# Patient Record
Sex: Male | Born: 1940 | Hispanic: No | Marital: Married | Smoking: Current every day smoker
Health system: Southern US, Community
[De-identification: ages and names within clinical notes are randomized; demographics above are authoritative.]

## PROBLEM LIST (undated history)

## (undated) DIAGNOSIS — I999 Unspecified disorder of circulatory system: Secondary | ICD-10-CM

## (undated) DIAGNOSIS — Z86010 Personal history of colonic polyps: Secondary | ICD-10-CM

## (undated) DIAGNOSIS — F17200 Nicotine dependence, unspecified, uncomplicated: Secondary | ICD-10-CM

## (undated) DIAGNOSIS — N529 Male erectile dysfunction, unspecified: Secondary | ICD-10-CM

## (undated) DIAGNOSIS — I779 Disorder of arteries and arterioles, unspecified: Secondary | ICD-10-CM

## (undated) DIAGNOSIS — E669 Obesity, unspecified: Secondary | ICD-10-CM

## (undated) DIAGNOSIS — R7303 Prediabetes: Secondary | ICD-10-CM

## (undated) DIAGNOSIS — E785 Hyperlipidemia, unspecified: Secondary | ICD-10-CM

## (undated) DIAGNOSIS — N400 Enlarged prostate without lower urinary tract symptoms: Secondary | ICD-10-CM

## (undated) DIAGNOSIS — I10 Essential (primary) hypertension: Secondary | ICD-10-CM

## (undated) HISTORY — DX: Personal history of colonic polyps: Z86.010

## (undated) HISTORY — DX: Prediabetes: R73.03

## (undated) HISTORY — DX: Nicotine dependence, unspecified, uncomplicated: F17.200

## (undated) HISTORY — DX: Benign prostatic hyperplasia without lower urinary tract symptoms: N40.0

## (undated) HISTORY — DX: Unspecified disorder of circulatory system: I99.9

## (undated) HISTORY — PX: APPENDECTOMY: SHX54

## (undated) HISTORY — DX: Disorder of arteries and arterioles, unspecified: I77.9

## (undated) HISTORY — DX: Obesity, unspecified: E66.9

## (undated) HISTORY — PX: OTHER SURGICAL HISTORY: SHX169

## (undated) HISTORY — DX: Essential (primary) hypertension: I10

## (undated) HISTORY — DX: Male erectile dysfunction, unspecified: N52.9

## (undated) HISTORY — DX: Hyperlipidemia, unspecified: E78.5

---

## 2011-12-23 ENCOUNTER — Other Ambulatory Visit (HOSPITAL_BASED_OUTPATIENT_CLINIC_OR_DEPARTMENT_OTHER): Payer: Self-pay | Admitting: Family Medicine

## 2011-12-23 DIAGNOSIS — M545 Low back pain, unspecified: Secondary | ICD-10-CM

## 2011-12-26 ENCOUNTER — Ambulatory Visit (HOSPITAL_BASED_OUTPATIENT_CLINIC_OR_DEPARTMENT_OTHER)
Admission: RE | Admit: 2011-12-26 | Discharge: 2011-12-26 | Disposition: A | Discharge status: Home / Self Care | Payer: Medicare Other | Source: Ambulatory Visit | Attending: Family Medicine | Admitting: Family Medicine

## 2011-12-26 DIAGNOSIS — M5137 Other intervertebral disc degeneration, lumbosacral region: Secondary | ICD-10-CM

## 2011-12-26 DIAGNOSIS — M545 Low back pain, unspecified: Secondary | ICD-10-CM | POA: Insufficient documentation

## 2011-12-26 DIAGNOSIS — M519 Unspecified thoracic, thoracolumbar and lumbosacral intervertebral disc disorder: Secondary | ICD-10-CM | POA: Insufficient documentation

## 2011-12-26 DIAGNOSIS — R5381 Other malaise: Secondary | ICD-10-CM

## 2011-12-26 DIAGNOSIS — M5126 Other intervertebral disc displacement, lumbar region: Secondary | ICD-10-CM | POA: Insufficient documentation

## 2011-12-26 DIAGNOSIS — M6281 Muscle weakness (generalized): Secondary | ICD-10-CM | POA: Insufficient documentation

## 2016-01-31 DIAGNOSIS — C44629 Squamous cell carcinoma of skin of left upper limb, including shoulder: Secondary | ICD-10-CM | POA: Diagnosis not present

## 2016-01-31 DIAGNOSIS — L718 Other rosacea: Secondary | ICD-10-CM | POA: Diagnosis not present

## 2016-01-31 DIAGNOSIS — L570 Actinic keratosis: Secondary | ICD-10-CM | POA: Diagnosis not present

## 2016-01-31 DIAGNOSIS — C4441 Basal cell carcinoma of skin of scalp and neck: Secondary | ICD-10-CM | POA: Diagnosis not present

## 2016-01-31 DIAGNOSIS — L821 Other seborrheic keratosis: Secondary | ICD-10-CM | POA: Diagnosis not present

## 2016-01-31 DIAGNOSIS — L853 Xerosis cutis: Secondary | ICD-10-CM | POA: Diagnosis not present

## 2016-01-31 DIAGNOSIS — D1801 Hemangioma of skin and subcutaneous tissue: Secondary | ICD-10-CM | POA: Diagnosis not present

## 2016-01-31 DIAGNOSIS — D485 Neoplasm of uncertain behavior of skin: Secondary | ICD-10-CM | POA: Diagnosis not present

## 2016-01-31 DIAGNOSIS — Z85828 Personal history of other malignant neoplasm of skin: Secondary | ICD-10-CM | POA: Diagnosis not present

## 2016-03-23 DIAGNOSIS — Z0000 Encounter for general adult medical examination without abnormal findings: Secondary | ICD-10-CM | POA: Diagnosis not present

## 2016-03-23 DIAGNOSIS — R972 Elevated prostate specific antigen [PSA]: Secondary | ICD-10-CM | POA: Diagnosis not present

## 2016-05-22 DIAGNOSIS — H6123 Impacted cerumen, bilateral: Secondary | ICD-10-CM | POA: Diagnosis not present

## 2016-05-22 DIAGNOSIS — H9121 Sudden idiopathic hearing loss, right ear: Secondary | ICD-10-CM | POA: Diagnosis not present

## 2016-07-13 DIAGNOSIS — F17200 Nicotine dependence, unspecified, uncomplicated: Secondary | ICD-10-CM | POA: Diagnosis not present

## 2016-07-13 DIAGNOSIS — I10 Essential (primary) hypertension: Secondary | ICD-10-CM | POA: Diagnosis not present

## 2016-07-13 DIAGNOSIS — E7800 Pure hypercholesterolemia, unspecified: Secondary | ICD-10-CM | POA: Diagnosis not present

## 2016-07-13 DIAGNOSIS — R7303 Prediabetes: Secondary | ICD-10-CM | POA: Diagnosis not present

## 2016-07-13 DIAGNOSIS — E669 Obesity, unspecified: Secondary | ICD-10-CM | POA: Diagnosis not present

## 2016-08-25 DIAGNOSIS — L718 Other rosacea: Secondary | ICD-10-CM | POA: Diagnosis not present

## 2016-08-25 DIAGNOSIS — L812 Freckles: Secondary | ICD-10-CM | POA: Diagnosis not present

## 2016-08-25 DIAGNOSIS — B078 Other viral warts: Secondary | ICD-10-CM | POA: Diagnosis not present

## 2016-08-25 DIAGNOSIS — D485 Neoplasm of uncertain behavior of skin: Secondary | ICD-10-CM | POA: Diagnosis not present

## 2016-08-25 DIAGNOSIS — L570 Actinic keratosis: Secondary | ICD-10-CM | POA: Diagnosis not present

## 2016-08-25 DIAGNOSIS — B079 Viral wart, unspecified: Secondary | ICD-10-CM | POA: Diagnosis not present

## 2016-08-25 DIAGNOSIS — Z85828 Personal history of other malignant neoplasm of skin: Secondary | ICD-10-CM | POA: Diagnosis not present

## 2016-08-25 DIAGNOSIS — L281 Prurigo nodularis: Secondary | ICD-10-CM | POA: Diagnosis not present

## 2016-08-25 DIAGNOSIS — L821 Other seborrheic keratosis: Secondary | ICD-10-CM | POA: Diagnosis not present

## 2016-08-25 DIAGNOSIS — D1801 Hemangioma of skin and subcutaneous tissue: Secondary | ICD-10-CM | POA: Diagnosis not present

## 2016-08-25 DIAGNOSIS — L738 Other specified follicular disorders: Secondary | ICD-10-CM | POA: Diagnosis not present

## 2016-09-29 DIAGNOSIS — R972 Elevated prostate specific antigen [PSA]: Secondary | ICD-10-CM | POA: Diagnosis not present

## 2017-01-22 DIAGNOSIS — F17200 Nicotine dependence, unspecified, uncomplicated: Secondary | ICD-10-CM | POA: Diagnosis not present

## 2017-01-22 DIAGNOSIS — I10 Essential (primary) hypertension: Secondary | ICD-10-CM | POA: Diagnosis not present

## 2017-01-22 DIAGNOSIS — E669 Obesity, unspecified: Secondary | ICD-10-CM | POA: Diagnosis not present

## 2017-01-22 DIAGNOSIS — E7800 Pure hypercholesterolemia, unspecified: Secondary | ICD-10-CM | POA: Diagnosis not present

## 2017-01-22 DIAGNOSIS — R7303 Prediabetes: Secondary | ICD-10-CM | POA: Diagnosis not present

## 2017-02-25 DIAGNOSIS — L570 Actinic keratosis: Secondary | ICD-10-CM | POA: Diagnosis not present

## 2017-02-25 DIAGNOSIS — L821 Other seborrheic keratosis: Secondary | ICD-10-CM | POA: Diagnosis not present

## 2017-02-25 DIAGNOSIS — Z85828 Personal history of other malignant neoplasm of skin: Secondary | ICD-10-CM | POA: Diagnosis not present

## 2017-02-25 DIAGNOSIS — L853 Xerosis cutis: Secondary | ICD-10-CM | POA: Diagnosis not present

## 2017-06-07 DIAGNOSIS — M722 Plantar fascial fibromatosis: Secondary | ICD-10-CM | POA: Diagnosis not present

## 2017-07-06 DIAGNOSIS — M722 Plantar fascial fibromatosis: Secondary | ICD-10-CM | POA: Diagnosis not present

## 2017-07-22 DIAGNOSIS — R7303 Prediabetes: Secondary | ICD-10-CM | POA: Diagnosis not present

## 2017-07-22 DIAGNOSIS — I10 Essential (primary) hypertension: Secondary | ICD-10-CM | POA: Diagnosis not present

## 2017-07-22 DIAGNOSIS — E7800 Pure hypercholesterolemia, unspecified: Secondary | ICD-10-CM | POA: Diagnosis not present

## 2017-07-22 DIAGNOSIS — E669 Obesity, unspecified: Secondary | ICD-10-CM | POA: Diagnosis not present

## 2017-08-25 DIAGNOSIS — Z86010 Personal history of colonic polyps: Secondary | ICD-10-CM | POA: Diagnosis not present

## 2017-08-25 DIAGNOSIS — K5730 Diverticulosis of large intestine without perforation or abscess without bleeding: Secondary | ICD-10-CM | POA: Diagnosis not present

## 2017-08-25 DIAGNOSIS — K635 Polyp of colon: Secondary | ICD-10-CM | POA: Diagnosis not present

## 2017-08-31 DIAGNOSIS — K635 Polyp of colon: Secondary | ICD-10-CM | POA: Diagnosis not present

## 2017-09-02 DIAGNOSIS — L821 Other seborrheic keratosis: Secondary | ICD-10-CM | POA: Diagnosis not present

## 2017-09-02 DIAGNOSIS — L570 Actinic keratosis: Secondary | ICD-10-CM | POA: Diagnosis not present

## 2017-09-02 DIAGNOSIS — D1801 Hemangioma of skin and subcutaneous tissue: Secondary | ICD-10-CM | POA: Diagnosis not present

## 2017-09-02 DIAGNOSIS — Z85828 Personal history of other malignant neoplasm of skin: Secondary | ICD-10-CM | POA: Diagnosis not present

## 2017-10-07 DIAGNOSIS — R972 Elevated prostate specific antigen [PSA]: Secondary | ICD-10-CM | POA: Diagnosis not present

## 2017-10-11 DIAGNOSIS — R351 Nocturia: Secondary | ICD-10-CM | POA: Diagnosis not present

## 2018-01-24 DIAGNOSIS — E669 Obesity, unspecified: Secondary | ICD-10-CM | POA: Diagnosis not present

## 2018-01-24 DIAGNOSIS — Z6833 Body mass index (BMI) 33.0-33.9, adult: Secondary | ICD-10-CM | POA: Diagnosis not present

## 2018-01-24 DIAGNOSIS — E7800 Pure hypercholesterolemia, unspecified: Secondary | ICD-10-CM | POA: Diagnosis not present

## 2018-01-24 DIAGNOSIS — I10 Essential (primary) hypertension: Secondary | ICD-10-CM | POA: Diagnosis not present

## 2018-02-28 DIAGNOSIS — L853 Xerosis cutis: Secondary | ICD-10-CM | POA: Diagnosis not present

## 2018-02-28 DIAGNOSIS — Z85828 Personal history of other malignant neoplasm of skin: Secondary | ICD-10-CM | POA: Diagnosis not present

## 2018-02-28 DIAGNOSIS — L821 Other seborrheic keratosis: Secondary | ICD-10-CM | POA: Diagnosis not present

## 2018-02-28 DIAGNOSIS — L570 Actinic keratosis: Secondary | ICD-10-CM | POA: Diagnosis not present

## 2018-07-25 DIAGNOSIS — E669 Obesity, unspecified: Secondary | ICD-10-CM | POA: Diagnosis not present

## 2018-07-25 DIAGNOSIS — Z0000 Encounter for general adult medical examination without abnormal findings: Secondary | ICD-10-CM | POA: Diagnosis not present

## 2018-07-25 DIAGNOSIS — E7800 Pure hypercholesterolemia, unspecified: Secondary | ICD-10-CM | POA: Diagnosis not present

## 2018-07-25 DIAGNOSIS — I10 Essential (primary) hypertension: Secondary | ICD-10-CM | POA: Diagnosis not present

## 2018-09-02 DIAGNOSIS — Z85828 Personal history of other malignant neoplasm of skin: Secondary | ICD-10-CM | POA: Diagnosis not present

## 2018-09-02 DIAGNOSIS — L570 Actinic keratosis: Secondary | ICD-10-CM | POA: Diagnosis not present

## 2018-09-02 DIAGNOSIS — L821 Other seborrheic keratosis: Secondary | ICD-10-CM | POA: Diagnosis not present

## 2018-09-02 DIAGNOSIS — D1801 Hemangioma of skin and subcutaneous tissue: Secondary | ICD-10-CM | POA: Diagnosis not present

## 2018-10-10 DIAGNOSIS — N400 Enlarged prostate without lower urinary tract symptoms: Secondary | ICD-10-CM | POA: Diagnosis not present

## 2018-10-25 DIAGNOSIS — R3912 Poor urinary stream: Secondary | ICD-10-CM | POA: Diagnosis not present

## 2018-10-25 DIAGNOSIS — N401 Enlarged prostate with lower urinary tract symptoms: Secondary | ICD-10-CM | POA: Diagnosis not present

## 2019-01-25 DIAGNOSIS — E669 Obesity, unspecified: Secondary | ICD-10-CM | POA: Diagnosis not present

## 2019-01-25 DIAGNOSIS — E7800 Pure hypercholesterolemia, unspecified: Secondary | ICD-10-CM | POA: Diagnosis not present

## 2019-01-25 DIAGNOSIS — I10 Essential (primary) hypertension: Secondary | ICD-10-CM | POA: Diagnosis not present

## 2019-03-06 DIAGNOSIS — L821 Other seborrheic keratosis: Secondary | ICD-10-CM | POA: Diagnosis not present

## 2019-03-06 DIAGNOSIS — Z85828 Personal history of other malignant neoplasm of skin: Secondary | ICD-10-CM | POA: Diagnosis not present

## 2019-03-06 DIAGNOSIS — L570 Actinic keratosis: Secondary | ICD-10-CM | POA: Diagnosis not present

## 2019-03-06 DIAGNOSIS — L853 Xerosis cutis: Secondary | ICD-10-CM | POA: Diagnosis not present

## 2019-06-12 DIAGNOSIS — H524 Presbyopia: Secondary | ICD-10-CM | POA: Diagnosis not present

## 2019-06-12 DIAGNOSIS — H0102B Squamous blepharitis left eye, upper and lower eyelids: Secondary | ICD-10-CM | POA: Diagnosis not present

## 2019-06-12 DIAGNOSIS — H0102A Squamous blepharitis right eye, upper and lower eyelids: Secondary | ICD-10-CM | POA: Diagnosis not present

## 2019-06-12 DIAGNOSIS — H02132 Senile ectropion of right lower eyelid: Secondary | ICD-10-CM | POA: Diagnosis not present

## 2019-06-12 DIAGNOSIS — H2513 Age-related nuclear cataract, bilateral: Secondary | ICD-10-CM | POA: Diagnosis not present

## 2019-07-14 DIAGNOSIS — H0102B Squamous blepharitis left eye, upper and lower eyelids: Secondary | ICD-10-CM | POA: Diagnosis not present

## 2019-07-14 DIAGNOSIS — H02132 Senile ectropion of right lower eyelid: Secondary | ICD-10-CM | POA: Diagnosis not present

## 2019-07-14 DIAGNOSIS — H0102A Squamous blepharitis right eye, upper and lower eyelids: Secondary | ICD-10-CM | POA: Diagnosis not present

## 2019-07-27 DIAGNOSIS — Z0120 Encounter for dental examination and cleaning without abnormal findings: Secondary | ICD-10-CM | POA: Diagnosis not present

## 2019-08-03 DIAGNOSIS — M79606 Pain in leg, unspecified: Secondary | ICD-10-CM | POA: Diagnosis not present

## 2019-08-04 DIAGNOSIS — M79606 Pain in leg, unspecified: Secondary | ICD-10-CM | POA: Diagnosis not present

## 2019-08-04 DIAGNOSIS — M79604 Pain in right leg: Secondary | ICD-10-CM | POA: Diagnosis not present

## 2019-08-04 DIAGNOSIS — M79605 Pain in left leg: Secondary | ICD-10-CM | POA: Diagnosis not present

## 2019-08-09 DIAGNOSIS — Z0000 Encounter for general adult medical examination without abnormal findings: Secondary | ICD-10-CM | POA: Diagnosis not present

## 2019-08-09 DIAGNOSIS — I10 Essential (primary) hypertension: Secondary | ICD-10-CM | POA: Diagnosis not present

## 2019-08-09 DIAGNOSIS — E7800 Pure hypercholesterolemia, unspecified: Secondary | ICD-10-CM | POA: Diagnosis not present

## 2019-08-09 DIAGNOSIS — E669 Obesity, unspecified: Secondary | ICD-10-CM | POA: Diagnosis not present

## 2019-08-14 DIAGNOSIS — H0102B Squamous blepharitis left eye, upper and lower eyelids: Secondary | ICD-10-CM | POA: Diagnosis not present

## 2019-08-14 DIAGNOSIS — H0102A Squamous blepharitis right eye, upper and lower eyelids: Secondary | ICD-10-CM | POA: Diagnosis not present

## 2019-08-18 DIAGNOSIS — Z720 Tobacco use: Secondary | ICD-10-CM | POA: Diagnosis not present

## 2019-08-18 DIAGNOSIS — I739 Peripheral vascular disease, unspecified: Secondary | ICD-10-CM | POA: Diagnosis not present

## 2019-08-23 ENCOUNTER — Encounter: Payer: Self-pay | Admitting: Vascular Surgery

## 2019-09-06 DIAGNOSIS — I10 Essential (primary) hypertension: Secondary | ICD-10-CM | POA: Diagnosis not present

## 2019-09-06 DIAGNOSIS — Z79899 Other long term (current) drug therapy: Secondary | ICD-10-CM | POA: Diagnosis not present

## 2019-09-06 DIAGNOSIS — F17200 Nicotine dependence, unspecified, uncomplicated: Secondary | ICD-10-CM | POA: Diagnosis not present

## 2019-09-06 DIAGNOSIS — Z7982 Long term (current) use of aspirin: Secondary | ICD-10-CM | POA: Diagnosis not present

## 2019-09-06 DIAGNOSIS — I771 Stricture of artery: Secondary | ICD-10-CM | POA: Diagnosis not present

## 2019-09-06 DIAGNOSIS — I739 Peripheral vascular disease, unspecified: Secondary | ICD-10-CM | POA: Diagnosis not present

## 2019-09-06 DIAGNOSIS — E785 Hyperlipidemia, unspecified: Secondary | ICD-10-CM | POA: Diagnosis not present

## 2019-09-11 DIAGNOSIS — L570 Actinic keratosis: Secondary | ICD-10-CM | POA: Diagnosis not present

## 2019-09-11 DIAGNOSIS — Z85828 Personal history of other malignant neoplasm of skin: Secondary | ICD-10-CM | POA: Diagnosis not present

## 2019-09-11 DIAGNOSIS — L821 Other seborrheic keratosis: Secondary | ICD-10-CM | POA: Diagnosis not present

## 2019-09-11 DIAGNOSIS — L812 Freckles: Secondary | ICD-10-CM | POA: Diagnosis not present

## 2019-09-21 DIAGNOSIS — Z720 Tobacco use: Secondary | ICD-10-CM | POA: Diagnosis not present

## 2019-09-21 DIAGNOSIS — Z48812 Encounter for surgical aftercare following surgery on the circulatory system: Secondary | ICD-10-CM | POA: Diagnosis not present

## 2019-09-21 DIAGNOSIS — I739 Peripheral vascular disease, unspecified: Secondary | ICD-10-CM | POA: Diagnosis not present

## 2019-09-27 DIAGNOSIS — N400 Enlarged prostate without lower urinary tract symptoms: Secondary | ICD-10-CM | POA: Diagnosis not present

## 2019-10-02 DIAGNOSIS — Z125 Encounter for screening for malignant neoplasm of prostate: Secondary | ICD-10-CM | POA: Diagnosis not present

## 2019-10-02 DIAGNOSIS — R3912 Poor urinary stream: Secondary | ICD-10-CM | POA: Diagnosis not present

## 2019-10-02 DIAGNOSIS — R972 Elevated prostate specific antigen [PSA]: Secondary | ICD-10-CM | POA: Diagnosis not present

## 2019-10-12 DIAGNOSIS — Z720 Tobacco use: Secondary | ICD-10-CM | POA: Diagnosis not present

## 2019-10-12 DIAGNOSIS — I739 Peripheral vascular disease, unspecified: Secondary | ICD-10-CM | POA: Diagnosis not present

## 2019-10-12 DIAGNOSIS — Z48812 Encounter for surgical aftercare following surgery on the circulatory system: Secondary | ICD-10-CM | POA: Diagnosis not present

## 2019-11-27 DIAGNOSIS — Z0120 Encounter for dental examination and cleaning without abnormal findings: Secondary | ICD-10-CM | POA: Diagnosis not present

## 2019-12-04 DIAGNOSIS — Z0120 Encounter for dental examination and cleaning without abnormal findings: Secondary | ICD-10-CM | POA: Diagnosis not present

## 2020-03-12 DIAGNOSIS — L578 Other skin changes due to chronic exposure to nonionizing radiation: Secondary | ICD-10-CM | POA: Diagnosis not present

## 2020-03-12 DIAGNOSIS — L821 Other seborrheic keratosis: Secondary | ICD-10-CM | POA: Diagnosis not present

## 2020-03-12 DIAGNOSIS — L570 Actinic keratosis: Secondary | ICD-10-CM | POA: Diagnosis not present

## 2020-03-12 DIAGNOSIS — Z85828 Personal history of other malignant neoplasm of skin: Secondary | ICD-10-CM | POA: Diagnosis not present

## 2020-04-08 DIAGNOSIS — N400 Enlarged prostate without lower urinary tract symptoms: Secondary | ICD-10-CM | POA: Diagnosis not present

## 2020-04-11 DIAGNOSIS — Z720 Tobacco use: Secondary | ICD-10-CM | POA: Diagnosis not present

## 2020-04-11 DIAGNOSIS — I739 Peripheral vascular disease, unspecified: Secondary | ICD-10-CM | POA: Diagnosis not present

## 2020-04-11 DIAGNOSIS — Z48812 Encounter for surgical aftercare following surgery on the circulatory system: Secondary | ICD-10-CM | POA: Diagnosis not present

## 2020-04-15 DIAGNOSIS — N401 Enlarged prostate with lower urinary tract symptoms: Secondary | ICD-10-CM | POA: Diagnosis not present

## 2020-04-15 DIAGNOSIS — R972 Elevated prostate specific antigen [PSA]: Secondary | ICD-10-CM | POA: Diagnosis not present

## 2020-04-15 DIAGNOSIS — R3912 Poor urinary stream: Secondary | ICD-10-CM | POA: Diagnosis not present

## 2020-04-23 ENCOUNTER — Other Ambulatory Visit: Payer: Self-pay | Admitting: Urology

## 2020-04-23 DIAGNOSIS — R972 Elevated prostate specific antigen [PSA]: Secondary | ICD-10-CM

## 2020-05-18 ENCOUNTER — Other Ambulatory Visit: Payer: Self-pay

## 2020-05-18 ENCOUNTER — Ambulatory Visit
Admission: RE | Admit: 2020-05-18 | Discharge: 2020-05-18 | Disposition: A | Discharge status: Home / Self Care | Payer: Medicare Other | Source: Ambulatory Visit | Attending: Urology | Admitting: Urology

## 2020-05-18 DIAGNOSIS — R972 Elevated prostate specific antigen [PSA]: Secondary | ICD-10-CM | POA: Diagnosis not present

## 2020-05-18 MED ORDER — GADOBENATE DIMEGLUMINE 529 MG/ML IV SOLN
20.0000 mL | Freq: Once | INTRAVENOUS | Status: AC | PRN
  Administered 2020-05-18: 20 mL via INTRAVENOUS

## 2020-08-05 DIAGNOSIS — Z0120 Encounter for dental examination and cleaning without abnormal findings: Secondary | ICD-10-CM | POA: Diagnosis not present

## 2020-08-13 DIAGNOSIS — I779 Disorder of arteries and arterioles, unspecified: Secondary | ICD-10-CM | POA: Diagnosis not present

## 2020-08-13 DIAGNOSIS — I10 Essential (primary) hypertension: Secondary | ICD-10-CM | POA: Diagnosis not present

## 2020-08-13 DIAGNOSIS — F17200 Nicotine dependence, unspecified, uncomplicated: Secondary | ICD-10-CM | POA: Diagnosis not present

## 2020-08-13 DIAGNOSIS — E7800 Pure hypercholesterolemia, unspecified: Secondary | ICD-10-CM | POA: Diagnosis not present

## 2020-08-19 DIAGNOSIS — H2513 Age-related nuclear cataract, bilateral: Secondary | ICD-10-CM | POA: Diagnosis not present

## 2020-08-19 DIAGNOSIS — H04123 Dry eye syndrome of bilateral lacrimal glands: Secondary | ICD-10-CM | POA: Diagnosis not present

## 2020-09-16 DIAGNOSIS — H04123 Dry eye syndrome of bilateral lacrimal glands: Secondary | ICD-10-CM | POA: Diagnosis not present

## 2020-09-16 DIAGNOSIS — H2513 Age-related nuclear cataract, bilateral: Secondary | ICD-10-CM | POA: Diagnosis not present

## 2020-09-24 DIAGNOSIS — L578 Other skin changes due to chronic exposure to nonionizing radiation: Secondary | ICD-10-CM | POA: Diagnosis not present

## 2020-09-24 DIAGNOSIS — L570 Actinic keratosis: Secondary | ICD-10-CM | POA: Diagnosis not present

## 2020-09-24 DIAGNOSIS — Z85828 Personal history of other malignant neoplasm of skin: Secondary | ICD-10-CM | POA: Diagnosis not present

## 2020-09-24 DIAGNOSIS — L738 Other specified follicular disorders: Secondary | ICD-10-CM | POA: Diagnosis not present

## 2020-09-24 DIAGNOSIS — C44622 Squamous cell carcinoma of skin of right upper limb, including shoulder: Secondary | ICD-10-CM | POA: Diagnosis not present

## 2020-10-11 DIAGNOSIS — H25013 Cortical age-related cataract, bilateral: Secondary | ICD-10-CM | POA: Diagnosis not present

## 2020-10-16 DIAGNOSIS — Z48812 Encounter for surgical aftercare following surgery on the circulatory system: Secondary | ICD-10-CM | POA: Diagnosis not present

## 2020-10-16 DIAGNOSIS — I739 Peripheral vascular disease, unspecified: Secondary | ICD-10-CM | POA: Diagnosis not present

## 2020-10-29 DIAGNOSIS — Z48812 Encounter for surgical aftercare following surgery on the circulatory system: Secondary | ICD-10-CM | POA: Diagnosis not present

## 2020-10-29 DIAGNOSIS — Z720 Tobacco use: Secondary | ICD-10-CM | POA: Diagnosis not present

## 2020-10-29 DIAGNOSIS — I739 Peripheral vascular disease, unspecified: Secondary | ICD-10-CM | POA: Diagnosis not present

## 2020-11-12 DIAGNOSIS — R972 Elevated prostate specific antigen [PSA]: Secondary | ICD-10-CM | POA: Diagnosis not present

## 2020-11-19 DIAGNOSIS — R3912 Poor urinary stream: Secondary | ICD-10-CM | POA: Diagnosis not present

## 2020-11-19 DIAGNOSIS — R972 Elevated prostate specific antigen [PSA]: Secondary | ICD-10-CM | POA: Diagnosis not present

## 2020-11-19 DIAGNOSIS — R351 Nocturia: Secondary | ICD-10-CM | POA: Diagnosis not present

## 2020-12-26 DIAGNOSIS — D075 Carcinoma in situ of prostate: Secondary | ICD-10-CM | POA: Diagnosis not present

## 2020-12-26 DIAGNOSIS — R972 Elevated prostate specific antigen [PSA]: Secondary | ICD-10-CM | POA: Diagnosis not present

## 2021-01-01 DIAGNOSIS — H25811 Combined forms of age-related cataract, right eye: Secondary | ICD-10-CM | POA: Diagnosis not present

## 2021-01-01 DIAGNOSIS — H2511 Age-related nuclear cataract, right eye: Secondary | ICD-10-CM | POA: Diagnosis not present

## 2021-01-15 DIAGNOSIS — H25812 Combined forms of age-related cataract, left eye: Secondary | ICD-10-CM | POA: Diagnosis not present

## 2021-01-15 DIAGNOSIS — H2512 Age-related nuclear cataract, left eye: Secondary | ICD-10-CM | POA: Diagnosis not present

## 2021-02-14 DIAGNOSIS — F17200 Nicotine dependence, unspecified, uncomplicated: Secondary | ICD-10-CM | POA: Diagnosis not present

## 2021-02-14 DIAGNOSIS — E7800 Pure hypercholesterolemia, unspecified: Secondary | ICD-10-CM | POA: Diagnosis not present

## 2021-02-14 DIAGNOSIS — I10 Essential (primary) hypertension: Secondary | ICD-10-CM | POA: Diagnosis not present

## 2021-02-14 DIAGNOSIS — I779 Disorder of arteries and arterioles, unspecified: Secondary | ICD-10-CM | POA: Diagnosis not present

## 2021-03-25 DIAGNOSIS — Z85828 Personal history of other malignant neoplasm of skin: Secondary | ICD-10-CM | POA: Diagnosis not present

## 2021-03-25 DIAGNOSIS — L853 Xerosis cutis: Secondary | ICD-10-CM | POA: Diagnosis not present

## 2021-03-25 DIAGNOSIS — D0461 Carcinoma in situ of skin of right upper limb, including shoulder: Secondary | ICD-10-CM | POA: Diagnosis not present

## 2021-03-25 DIAGNOSIS — C44629 Squamous cell carcinoma of skin of left upper limb, including shoulder: Secondary | ICD-10-CM | POA: Diagnosis not present

## 2021-03-25 DIAGNOSIS — C44319 Basal cell carcinoma of skin of other parts of face: Secondary | ICD-10-CM | POA: Diagnosis not present

## 2021-03-25 DIAGNOSIS — L820 Inflamed seborrheic keratosis: Secondary | ICD-10-CM | POA: Diagnosis not present

## 2021-03-25 DIAGNOSIS — L821 Other seborrheic keratosis: Secondary | ICD-10-CM | POA: Diagnosis not present

## 2021-03-25 DIAGNOSIS — C44329 Squamous cell carcinoma of skin of other parts of face: Secondary | ICD-10-CM | POA: Diagnosis not present

## 2021-04-23 DIAGNOSIS — Z85828 Personal history of other malignant neoplasm of skin: Secondary | ICD-10-CM | POA: Diagnosis not present

## 2021-04-23 DIAGNOSIS — C44321 Squamous cell carcinoma of skin of nose: Secondary | ICD-10-CM | POA: Diagnosis not present

## 2021-05-14 DIAGNOSIS — C44219 Basal cell carcinoma of skin of left ear and external auricular canal: Secondary | ICD-10-CM | POA: Diagnosis not present

## 2021-05-14 DIAGNOSIS — D485 Neoplasm of uncertain behavior of skin: Secondary | ICD-10-CM | POA: Diagnosis not present

## 2021-06-10 DIAGNOSIS — C44219 Basal cell carcinoma of skin of left ear and external auricular canal: Secondary | ICD-10-CM | POA: Diagnosis not present

## 2021-06-10 DIAGNOSIS — Z85828 Personal history of other malignant neoplasm of skin: Secondary | ICD-10-CM | POA: Diagnosis not present

## 2021-07-01 DIAGNOSIS — R972 Elevated prostate specific antigen [PSA]: Secondary | ICD-10-CM | POA: Diagnosis not present

## 2021-07-08 DIAGNOSIS — N401 Enlarged prostate with lower urinary tract symptoms: Secondary | ICD-10-CM | POA: Diagnosis not present

## 2021-07-08 DIAGNOSIS — R3912 Poor urinary stream: Secondary | ICD-10-CM | POA: Diagnosis not present

## 2021-07-08 DIAGNOSIS — R972 Elevated prostate specific antigen [PSA]: Secondary | ICD-10-CM | POA: Diagnosis not present

## 2021-07-29 DIAGNOSIS — C44629 Squamous cell carcinoma of skin of left upper limb, including shoulder: Secondary | ICD-10-CM | POA: Diagnosis not present

## 2021-07-29 DIAGNOSIS — Z85828 Personal history of other malignant neoplasm of skin: Secondary | ICD-10-CM | POA: Diagnosis not present

## 2021-07-29 DIAGNOSIS — L570 Actinic keratosis: Secondary | ICD-10-CM | POA: Diagnosis not present

## 2021-07-29 DIAGNOSIS — L821 Other seborrheic keratosis: Secondary | ICD-10-CM | POA: Diagnosis not present

## 2021-07-29 DIAGNOSIS — D1801 Hemangioma of skin and subcutaneous tissue: Secondary | ICD-10-CM | POA: Diagnosis not present

## 2021-08-14 DIAGNOSIS — I779 Disorder of arteries and arterioles, unspecified: Secondary | ICD-10-CM | POA: Diagnosis not present

## 2021-08-14 DIAGNOSIS — I10 Essential (primary) hypertension: Secondary | ICD-10-CM | POA: Diagnosis not present

## 2021-08-14 DIAGNOSIS — F17200 Nicotine dependence, unspecified, uncomplicated: Secondary | ICD-10-CM | POA: Diagnosis not present

## 2021-08-14 DIAGNOSIS — E7800 Pure hypercholesterolemia, unspecified: Secondary | ICD-10-CM | POA: Diagnosis not present

## 2021-08-18 DIAGNOSIS — H524 Presbyopia: Secondary | ICD-10-CM | POA: Diagnosis not present

## 2021-10-29 DIAGNOSIS — Z720 Tobacco use: Secondary | ICD-10-CM | POA: Diagnosis not present

## 2021-10-29 DIAGNOSIS — Z48812 Encounter for surgical aftercare following surgery on the circulatory system: Secondary | ICD-10-CM | POA: Diagnosis not present

## 2021-10-29 DIAGNOSIS — Z85828 Personal history of other malignant neoplasm of skin: Secondary | ICD-10-CM | POA: Diagnosis not present

## 2021-10-29 DIAGNOSIS — I739 Peripheral vascular disease, unspecified: Secondary | ICD-10-CM | POA: Diagnosis not present

## 2021-10-29 DIAGNOSIS — I724 Aneurysm of artery of lower extremity: Secondary | ICD-10-CM | POA: Diagnosis not present

## 2021-10-29 DIAGNOSIS — L821 Other seborrheic keratosis: Secondary | ICD-10-CM | POA: Diagnosis not present

## 2021-10-29 DIAGNOSIS — L570 Actinic keratosis: Secondary | ICD-10-CM | POA: Diagnosis not present

## 2021-11-05 DIAGNOSIS — Z136 Encounter for screening for cardiovascular disorders: Secondary | ICD-10-CM | POA: Diagnosis not present

## 2021-11-05 DIAGNOSIS — Z720 Tobacco use: Secondary | ICD-10-CM | POA: Diagnosis not present

## 2021-11-05 DIAGNOSIS — Z48812 Encounter for surgical aftercare following surgery on the circulatory system: Secondary | ICD-10-CM | POA: Diagnosis not present

## 2021-11-05 DIAGNOSIS — I739 Peripheral vascular disease, unspecified: Secondary | ICD-10-CM | POA: Diagnosis not present

## 2022-01-06 DIAGNOSIS — R972 Elevated prostate specific antigen [PSA]: Secondary | ICD-10-CM | POA: Diagnosis not present

## 2022-01-13 DIAGNOSIS — R3912 Poor urinary stream: Secondary | ICD-10-CM | POA: Diagnosis not present

## 2022-01-13 DIAGNOSIS — R972 Elevated prostate specific antigen [PSA]: Secondary | ICD-10-CM | POA: Diagnosis not present

## 2022-01-13 DIAGNOSIS — N401 Enlarged prostate with lower urinary tract symptoms: Secondary | ICD-10-CM | POA: Diagnosis not present

## 2022-01-13 DIAGNOSIS — R351 Nocturia: Secondary | ICD-10-CM | POA: Diagnosis not present

## 2022-01-25 DIAGNOSIS — I10 Essential (primary) hypertension: Secondary | ICD-10-CM | POA: Diagnosis not present

## 2022-01-25 DIAGNOSIS — E7800 Pure hypercholesterolemia, unspecified: Secondary | ICD-10-CM | POA: Diagnosis not present

## 2022-02-11 DIAGNOSIS — H6123 Impacted cerumen, bilateral: Secondary | ICD-10-CM | POA: Diagnosis not present

## 2022-02-16 DIAGNOSIS — I779 Disorder of arteries and arterioles, unspecified: Secondary | ICD-10-CM | POA: Diagnosis not present

## 2022-02-16 DIAGNOSIS — E7800 Pure hypercholesterolemia, unspecified: Secondary | ICD-10-CM | POA: Diagnosis not present

## 2022-02-16 DIAGNOSIS — I10 Essential (primary) hypertension: Secondary | ICD-10-CM | POA: Diagnosis not present

## 2022-02-27 ENCOUNTER — Encounter (HOSPITAL_COMMUNITY): Payer: Medicare Other

## 2022-03-02 DIAGNOSIS — L570 Actinic keratosis: Secondary | ICD-10-CM | POA: Diagnosis not present

## 2022-03-02 DIAGNOSIS — L821 Other seborrheic keratosis: Secondary | ICD-10-CM | POA: Diagnosis not present

## 2022-03-02 DIAGNOSIS — Z85828 Personal history of other malignant neoplasm of skin: Secondary | ICD-10-CM | POA: Diagnosis not present

## 2022-03-02 DIAGNOSIS — D1801 Hemangioma of skin and subcutaneous tissue: Secondary | ICD-10-CM | POA: Diagnosis not present

## 2022-03-09 ENCOUNTER — Ambulatory Visit (HOSPITAL_COMMUNITY)
Admission: RE | Admit: 2022-03-09 | Discharge: 2022-03-09 | Disposition: A | Discharge status: Home / Self Care | Payer: Medicare Other | Source: Ambulatory Visit | Attending: Surgery | Admitting: Surgery

## 2022-03-09 ENCOUNTER — Other Ambulatory Visit: Payer: Self-pay | Admitting: *Deleted

## 2022-03-09 ENCOUNTER — Other Ambulatory Visit: Payer: Self-pay

## 2022-03-09 DIAGNOSIS — M25561 Pain in right knee: Secondary | ICD-10-CM | POA: Diagnosis not present

## 2022-03-09 DIAGNOSIS — M25562 Pain in left knee: Secondary | ICD-10-CM

## 2022-03-14 IMAGING — MR MR PROSTATE WO/W CM
56 series · 56 of 56 positions shown · IV contrast (20ml Multihance)
Comparison: None.

CLINICAL DATA: Elevated PSA (5.7).  Negative biopsy in 5532.

EXAM:
MR PROSTATE WITHOUT AND WITH CONTRAST
TECHNIQUE: Multiplanar multisequence MRI images were obtained of the pelvis
centered about the prostate. Pre and post contrast images were
obtained.
CONTRAST:  20mL MULTIHANCE GADOBENATE DIMEGLUMINE 529 MG/ML IV SOLN

[Series 3: bSSFP fat-sat · axial · 8.0mm · 0.74mm/px · 1 of 28 slices shown]
[im 1/28]
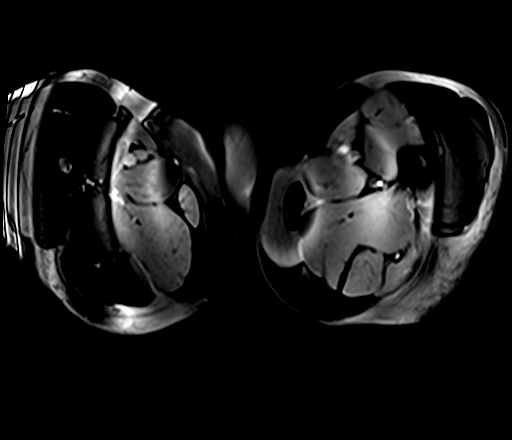

[Series 4: T1 · axial · 5.0mm · 1.25mm/px · 1 of 80 slices shown]
[im 1/80]
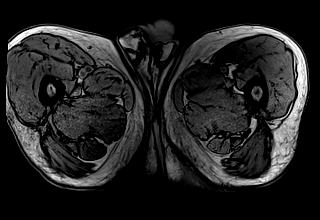

[Series 5: T2 · coronal · 3.5mm · 0.56mm/px · 1 of 27 slices shown (1 of 3)]
[im 1/27]
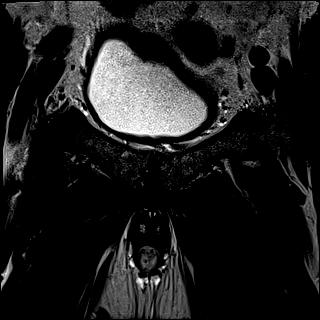

[Series 6: DWI · axial · 3.5mm · 1.75mm/px · 1 of 84 slices shown (1 of 3)]
[im 1/84]
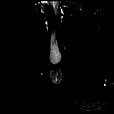

[Series 7: DWI · axial · 3.5mm · 1.75mm/px · 1 of 28 slices shown (2 of 3)]
[im 1/28]
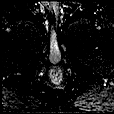

[Series 8: DWI · axial · 3.5mm · 1.56mm/px · 1 of 28 slices shown (3 of 3)]
[im 1/28]
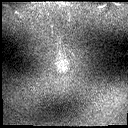

[Series 9: T2 · axial · 3.5mm · 0.56mm/px · 1 of 25 slices shown (2 of 3)]
[im 1/25]
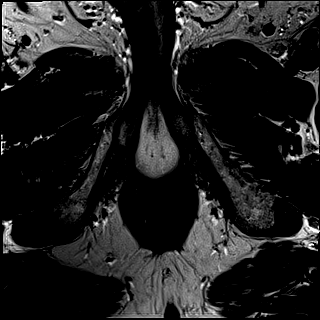

[Series 10: T2 · axial · 1.0mm · 1.04mm/px · 1 of 88 slices shown (3 of 3)]
[im 1/88]
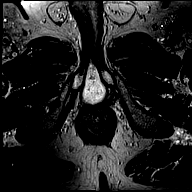

[Series 11: pre t1_twist_tra_dyn_ttc=6.4s · axial · non-contrast · 3.5mm · 0.83mm/px · 1 of 26 slices shown]
[im 1/26]
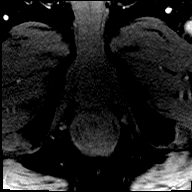

[Series 12: post t1_twist_tra_dyn-copy center · axial · 3.5mm · 0.83mm/px · 1 of 26 slices shown (1 of 24)]
[im 1/26]
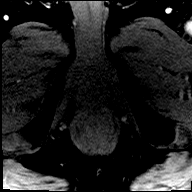

[Series 13: post t1_twist_tra_dyn-copy center · axial · 3.5mm · 0.83mm/px · 1 of 26 slices shown (2 of 24)]
[im 1/26]
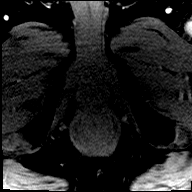

[Series 14: post t1_twist_tra_dyn-copy cent_sub_ttc=(id) · axial · 3.5mm · 0.83mm/px · 1 of 26 slices shown (1 of 23)]
[im 1/26]
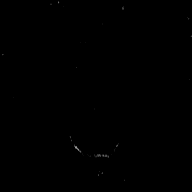

[Series 15: post t1_twist_tra_dyn-copy center · axial · 3.5mm · 0.83mm/px · 1 of 26 slices shown (3 of 24)]
[im 1/26]
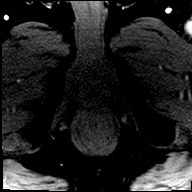

[Series 16: post t1_twist_tra_dyn-copy cent_sub_ttc=(id) · axial · 3.5mm · 0.83mm/px · 1 of 26 slices shown (2 of 23)]
[im 1/26]
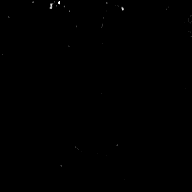

[Series 17: post t1_twist_tra_dyn-copy center · axial · 3.5mm · 0.83mm/px · 1 of 26 slices shown (4 of 24)]
[im 1/26]
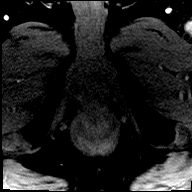

[Series 18: post t1_twist_tra_dyn-copy cent_sub_ttc=(id) · axial · 3.5mm · 0.83mm/px · 1 of 26 slices shown (3 of 23)]
[im 1/26]
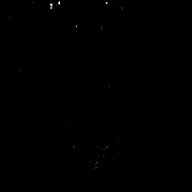

[Series 19: post t1_twist_tra_dyn-copy center · axial · 3.5mm · 0.83mm/px · 1 of 26 slices shown (5 of 24)]
[im 1/26]
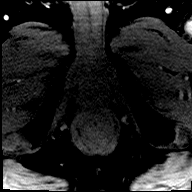

[Series 20: post t1_twist_tra_dyn-copy cent_sub_ttc=(id) · axial · 3.5mm · 0.83mm/px · 1 of 25 slices shown (4 of 23)]
[im 1/25]
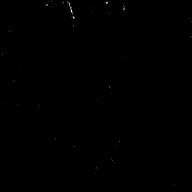

[Series 21: post t1_twist_tra_dyn-copy center · axial · 3.5mm · 0.83mm/px · 1 of 26 slices shown (6 of 24)]
[im 1/26]
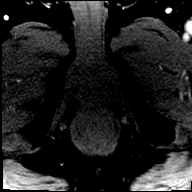

[Series 22: post t1_twist_tra_dyn-copy cent_sub_ttc=(id) · axial · 3.5mm · 0.83mm/px · 1 of 26 slices shown (5 of 23)]
[im 1/26]
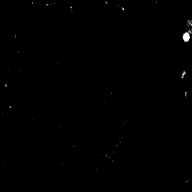

[Series 23: post t1_twist_tra_dyn-copy center · axial · 3.5mm · 0.83mm/px · 1 of 26 slices shown (7 of 24)]
[im 1/26]
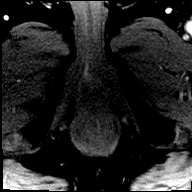

[Series 24: post t1_twist_tra_dyn-copy cent_sub_ttc=(id) · axial · 3.5mm · 0.83mm/px · 1 of 26 slices shown (6 of 23)]
[im 1/26]
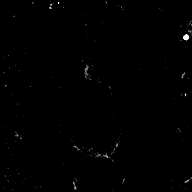

[Series 25: post t1_twist_tra_dyn-copy center · axial · 3.5mm · 0.83mm/px · 1 of 26 slices shown (8 of 24)]
[im 1/26]
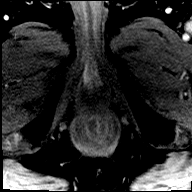

[Series 26: post t1_twist_tra_dyn-copy cent_sub_ttc=(id) · axial · 3.5mm · 0.83mm/px · 1 of 26 slices shown (7 of 23)]
[im 1/26]
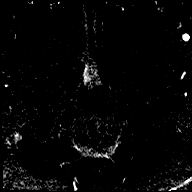

[Series 27: post t1_twist_tra_dyn-copy center · axial · 3.5mm · 0.83mm/px · 1 of 26 slices shown (9 of 24)]
[im 1/26]
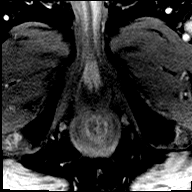

[Series 28: post t1_twist_tra_dyn-copy cent_sub_ttc=(id) · axial · 3.5mm · 0.83mm/px · 1 of 26 slices shown (8 of 23)]
[im 1/26]
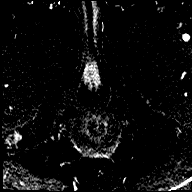

[Series 29: post t1_twist_tra_dyn-copy center · axial · 3.5mm · 0.83mm/px · 1 of 26 slices shown (10 of 24)]
[im 1/26]
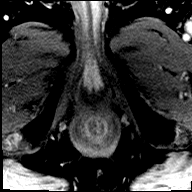

[Series 30: post t1_twist_tra_dyn-copy cent_sub_ttc=(id) · axial · 3.5mm · 0.83mm/px · 1 of 26 slices shown (9 of 23)]
[im 1/26]
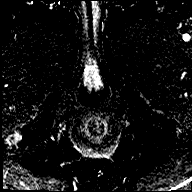

[Series 31: post t1_twist_tra_dyn-copy center · axial · 3.5mm · 0.83mm/px · 1 of 26 slices shown (11 of 24)]
[im 1/26]
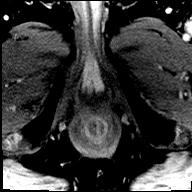

[Series 32: post t1_twist_tra_dyn-copy cent_sub_ttc=(id) · axial · 3.5mm · 0.83mm/px · 1 of 26 slices shown (10 of 23)]
[im 1/26]
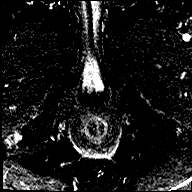

[Series 33: post t1_twist_tra_dyn-copy center · axial · 3.5mm · 0.83mm/px · 1 of 26 slices shown (12 of 24)]
[im 1/26]
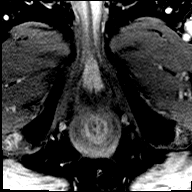

[Series 34: post t1_twist_tra_dyn-copy cent_sub_ttc=(id) · axial · 3.5mm · 0.83mm/px · 1 of 26 slices shown (11 of 23)]
[im 1/26]
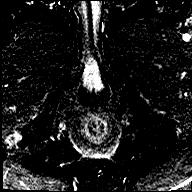

[Series 35: post t1_twist_tra_dyn-copy center · axial · 3.5mm · 0.83mm/px · 1 of 26 slices shown (13 of 24)]
[im 1/26]
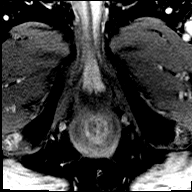

[Series 36: post t1_twist_tra_dyn-copy cent_sub_ttc=(id) · axial · 3.5mm · 0.83mm/px · 1 of 26 slices shown (12 of 23)]
[im 1/26]
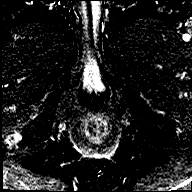

[Series 37: post t1_twist_tra_dyn-copy center · axial · 3.5mm · 0.83mm/px · 1 of 26 slices shown (14 of 24)]
[im 1/26]
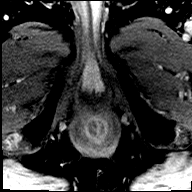

[Series 38: post t1_twist_tra_dyn-copy cent_sub_ttc=(id) · axial · 3.5mm · 0.83mm/px · 1 of 26 slices shown (13 of 23)]
[im 1/26]
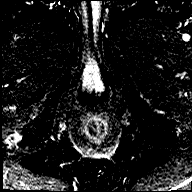

[Series 39: post t1_twist_tra_dyn-copy center · axial · 3.5mm · 0.83mm/px · 1 of 26 slices shown (15 of 24)]
[im 1/26]
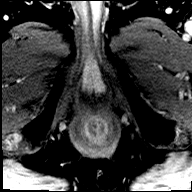

[Series 40: post t1_twist_tra_dyn-copy cent_sub_ttc=(id) · axial · 3.5mm · 0.83mm/px · 1 of 26 slices shown (14 of 23)]
[im 1/26]
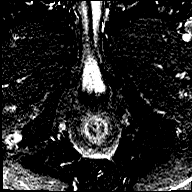

[Series 41: post t1_twist_tra_dyn-copy center · axial · 3.5mm · 0.83mm/px · 1 of 26 slices shown (16 of 24)]
[im 1/26]
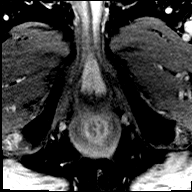

[Series 42: post t1_twist_tra_dyn-copy cent_sub_ttc=(id) · axial · 3.5mm · 0.83mm/px · 1 of 26 slices shown (15 of 23)]
[im 1/26]
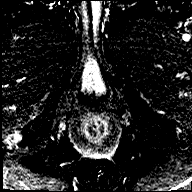

[Series 43: post t1_twist_tra_dyn-copy center · axial · 3.5mm · 0.83mm/px · 1 of 26 slices shown (17 of 24)]
[im 1/26]
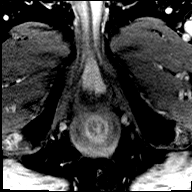

[Series 44: post t1_twist_tra_dyn-copy cent_sub_ttc=(id) · axial · 3.5mm · 0.83mm/px · 1 of 26 slices shown (16 of 23)]
[im 1/26]
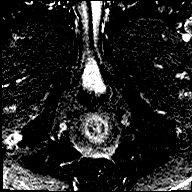

[Series 45: post t1_twist_tra_dyn-copy center · axial · 3.5mm · 0.83mm/px · 1 of 26 slices shown (18 of 24)]
[im 1/26]
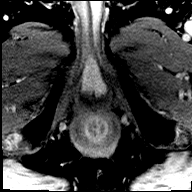

[Series 46: post t1_twist_tra_dyn-copy cent_sub_ttc=(id) · axial · 3.5mm · 0.83mm/px · 1 of 26 slices shown (17 of 23)]
[im 1/26]
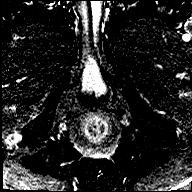

[Series 47: post t1_twist_tra_dyn-copy center · axial · 3.5mm · 0.83mm/px · 1 of 26 slices shown (19 of 24)]
[im 1/26]
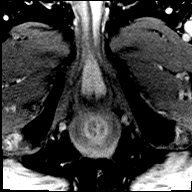

[Series 48: post t1_twist_tra_dyn-copy cent_sub_ttc=(id) · axial · 3.5mm · 0.83mm/px · 1 of 26 slices shown (18 of 23)]
[im 1/26]
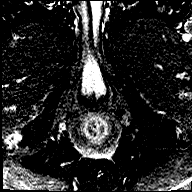

[Series 49: post t1_twist_tra_dyn-copy center · axial · 3.5mm · 0.83mm/px · 1 of 26 slices shown (20 of 24)]
[im 1/26]
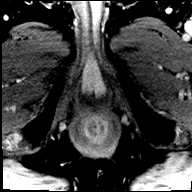

[Series 50: post t1_twist_tra_dyn-copy cent_sub_ttc=(id) · axial · 3.5mm · 0.83mm/px · 1 of 26 slices shown (19 of 23)]
[im 1/26]
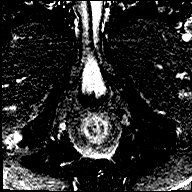

[Series 51: post t1_twist_tra_dyn-copy center · axial · 3.5mm · 0.83mm/px · 1 of 26 slices shown (21 of 24)]
[im 1/26]
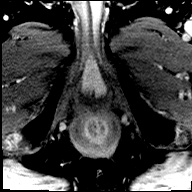

[Series 52: post t1_twist_tra_dyn-copy cent_sub_ttc=(id) · axial · 3.5mm · 0.83mm/px · 1 of 26 slices shown (20 of 23)]
[im 1/26]
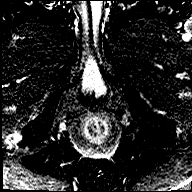

[Series 53: post t1_twist_tra_dyn-copy center · axial · 3.5mm · 0.83mm/px · 1 of 26 slices shown (22 of 24)]
[im 1/26]
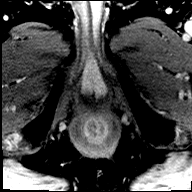

[Series 54: post t1_twist_tra_dyn-copy cent_sub_ttc=(id) · axial · 3.5mm · 0.83mm/px · 1 of 26 slices shown (21 of 23)]
[im 1/26]
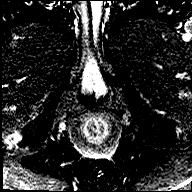

[Series 55: post t1_twist_tra_dyn-copy center · axial · 3.5mm · 0.83mm/px · 1 of 26 slices shown (23 of 24)]
[im 1/26]
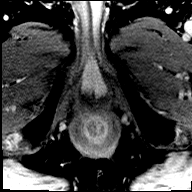

[Series 56: post t1_twist_tra_dyn-copy cent_sub_ttc=(id) · axial · 3.5mm · 0.83mm/px · 1 of 26 slices shown (22 of 23)]
[im 1/26]
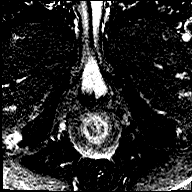

[Series 57: post t1_twist_tra_dyn-copy center · axial · 3.5mm · 0.83mm/px · 1 of 26 slices shown (24 of 24)]
[im 1/26]
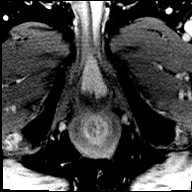

[Series 58: post t1_twist_tra_dyn-copy cent_sub_ttc=(id) · axial · 3.5mm · 0.83mm/px · 1 of 26 slices shown (23 of 23)]
[im 1/26]
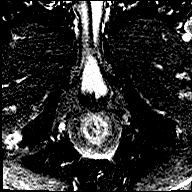

[56 of 56 positions shown; findings below may reference images not displayed]

FINDINGS: Prostate: Intermediate T2 signal within the peripheral zone.
However, there are no findings suspicious for high-grade macroscopic
prostate cancer on MRI. No focal low T2 lesion. No restricted
diffusion/low ADC. No early arterial enhancement. PI-RADS 1.

Enlargement/nodularity of the central gland, which indents the base
of the bladder, suggesting BPH. No suspicious central gland nodule
on T2.

Volume: 4.9 x 5.9 x 5.7 cm (volume = 86 mL)

Transcapsular spread:  Absent.

Seminal vesicle involvement: Absent.

Neurovascular bundle involvement: Absent.

Pelvic adenopathy: Absent.

Bone metastasis: Absent.

Other findings: Moderate fat in the bilateral inguinal canals.
IMPRESSION: No findings suspicious for high-grade macroscopic prostate cancer.
PI-RADS 1.

Suspected BPH.

Calculated prostate volume 86 mL.

## 2022-03-23 ENCOUNTER — Other Ambulatory Visit: Payer: Self-pay

## 2022-03-23 ENCOUNTER — Ambulatory Visit: Payer: Medicare Other | Admitting: Surgery

## 2022-03-23 ENCOUNTER — Encounter: Payer: Self-pay | Admitting: Surgery

## 2022-03-23 VITALS — BP 103/76 | HR 101 | Temp 97.9°F | Resp 20 | Ht 70.0 in | Wt 228.9 lb

## 2022-03-23 DIAGNOSIS — I724 Aneurysm of artery of lower extremity: Secondary | ICD-10-CM

## 2022-03-23 DIAGNOSIS — I70211 Atherosclerosis of native arteries of extremities with intermittent claudication, right leg: Secondary | ICD-10-CM | POA: Diagnosis not present

## 2022-03-23 NOTE — Progress Notes (Signed)
? ?[facility]  ? ?Patient name: Jay Ray MRN: [id number] DOB: 1941-09-18 Sex: male ? ? ?REQUESTING PROVIDER:  ? ? Joycelyn Rua ? ? ?REASON FOR CONSULT:  ?  ?PAD ? ?HISTORY OF PRESENT ILLNESS:  ? ?Jay Ray is a 81 y.o. male, who is referred for evaluation of peripheral vascular disease.  The patient has a history of atherectomy and stenting of the right superficial femoral artery by Dr. Luan Pulling at [facility] approximately 2 years ago.  He has been complaining of numbness in his right toes which did improve a little bit after the procedure.  He has tried gabapentin which did not give him any relief and so he discontinued it.  His most recent ultrasound showed a patent intervention.  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 ? ?He states that he usually walks three quarters of a mile every day.  Beginning about a month ago, he can only go about a quarter of a mile. ? ?The patient is a smoker with no interest in quitting.  He is on a statin for hypercholesterolemia.  He takes an ARB for hypertension. ? ?PAST MEDICAL HISTORY  ? ? ?Past Medical History:  ?Diagnosis Date  ? BPH (benign prostatic hyperplasia)   ? Decreased circulation   ? ED (erectile dysfunction)   ? History of colon polyps   ? Hyperlipidemia   ? Hypertension   ? Essential (primary)  ? Obesity   ? Peripheral arterial occlusive disease (HCC)   ? Prediabetes   ? Tobacco dependence   ? ? ? ?FAMILY HISTORY  ? ?History reviewed. No pertinent family history. ? ?SOCIAL HISTORY:  ? ?Social History  ? ?Socioeconomic History  ? Marital status: Married  ?  Spouse name: Not on file  ? Number of children: Not on file  ? Years of education: Not on file  ? Highest education level: Not on file  ?Occupational History  ? Not on file  ?Tobacco Use  ? Smoking status: Every Day  ?  Packs/day: 0.50  ?  Types: Cigarettes  ? Smokeless tobacco: Never  ?Vaping Use  ? Vaping Use: Never  used  ?Substance and Sexual Activity  ? Alcohol use: Yes  ?  Comment: occasional  ? Drug use: Not on file  ? Sexual activity: Not on file  ?Other Topics Concern  ? Not on file  ?Social History Narrative  ? Not on file  ? ?Social Determinants of Health  ? ?Financial Resource Strain: Not on file  ?Food Insecurity: Not on file  ?Transportation Needs: Not on file  ?Physical Activity: Not on file  ?Stress: Not on file  ?Social Connections: Not on file  ?Intimate Partner Violence: Not on file  ? ? ?ALLERGIES:  ? ? ?No Known Allergies ? ?CURRENT MEDICATIONS:  ? ? ?Current Outpatient Medications  ?Medication Sig Dispense Refill  ? aspirin 81 MG chewable tablet Chew by mouth daily.    ? clopidogrel (PLAVIX) 75 MG tablet Take 75 mg by mouth daily.    ? co-enzyme Q-10 30 MG capsule Take 30 mg by mouth 3 (three) times daily.    ? DOXAZOSIN MESYLATE PO Take 2 mg by mouth.    ? LOSARTAN POTASSIUM-HCTZ PO Take by mouth.    ? lovastatin (MEVACOR) 20 MG tablet Take 20 mg by mouth at bedtime.    ? ?No current facility-administered medications for this visit.  ? ? ?REVIEW OF SYSTEMS:  ? ?[X]  denotes  positive finding, [ ]  denotes negative finding ?Cardiac  Comments:  ?Chest pain or chest pressure:    ?Shortness of breath upon exertion:    ?Short of breath when lying flat:    ?Irregular heart rhythm:    ?    ?Vascular    ?Pain in calf, thigh, or hip brought on by ambulation: x   ?Pain in feet at night that wakes you up from your sleep:     ?Blood clot in your veins:    ?Leg swelling:     ?    ?Pulmonary    ?Oxygen at home:    ?Productive cough:     ?Wheezing:     ?    ?Neurologic    ?Sudden weakness in arms or legs:     ?Sudden numbness in arms or legs:     ?Sudden onset of difficulty speaking or slurred speech:    ?Temporary loss of vision in one eye:     ?Problems with dizziness:     ?    ?Gastrointestinal    ?Blood in stool:     ? ?Vomited blood:     ?    ?Genitourinary    ?Burning when urinating:     ?Blood in urine:    ?     ?Psychiatric    ?Major depression:     ?    ?Hematologic    ?Bleeding problems:    ?Problems with blood clotting too easily:    ?    ?Skin    ?Rashes or ulcers:    ?    ?Constitutional    ?Fever or chills:    ? ?PHYSICAL EXAM:  ? ?Vitals:  ? 03/23/22 1046  ?BP: 103/76  ?Pulse: (!) 101  ?Resp: 20  ?Temp: 97.9 ?F (36.6 ?C)  ?SpO2: 92%  ?Weight: 228 lb 14.4 oz (103.8 kg)  ?Height: 5\' 10"  (1.778 m)  ? ? ?GENERAL: The patient is a well-nourished male, in no acute distress. The vital signs are documented above. ?CARDIAC: There is a regular rate and rhythm.  ?VASCULAR: Nonpalpable right pedal pulses ?PULMONARY: Nonlabored respirations ?MUSCULOSKELETAL: There are no major deformities or cyanosis. ?NEUROLOGIC: No focal weakness or paresthesias are detected. ?SKIN: There are no ulcers or rashes noted. ?PSYCHIATRIC: The patient has a normal affect. ? ?STUDIES:  ? ?I have reviewed the following studies: ?+-------+-----------+-----------+------------+------------+  ?ABI/TBIToday's ABIToday's TBIPrevious ABIPrevious TBI  ?+-------+-----------+-----------+------------+------------+  ?Right  0.46       0.00                                 ?+-------+-----------+-----------+------------+------------+  ?Left   1.27       0.88                                 ?+-------+-----------+-----------+------------+------------+  ? ?ASSESSMENT and PLAN  ? ?Right leg numbness: He did not really get any significant relief from right leg intervention.  Even with a normal ABI, he still had right leg numbness.  This makes me think this is more a nerve issue than a vascular issue.  [facility] ? ?PAD: The patient has had a significant change in his leg symptoms beginning about a month ago.  His ABIs today show a significant drop when compared to normal studies in November 2022.  I  suspect that his stent is either occluded or developed a high-grade stenosis. ? 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  In order to get a true evaluation of the extent of the aneurysm, I am wearing a CT scan.  I will include his abdomen to rule out abdominal aneurysmal disease.  As soon as he gets his CAT scan he will follow-up with me. ? ? ?Durene Cal, IV, MD, FACS ?[facility] [place] ?Tel [phone] ?Pager [phone]  ?

## 2022-03-24 ENCOUNTER — Other Ambulatory Visit: Payer: Self-pay | Admitting: *Deleted

## 2022-03-24 ENCOUNTER — Other Ambulatory Visit: Payer: Self-pay

## 2022-03-24 ENCOUNTER — Other Ambulatory Visit: Payer: Self-pay | Admitting: Vascular Surgery

## 2022-03-24 DIAGNOSIS — I724 Aneurysm of artery of lower extremity: Secondary | ICD-10-CM

## 2022-03-24 DIAGNOSIS — R0989 Other specified symptoms and signs involving the circulatory and respiratory systems: Secondary | ICD-10-CM

## 2022-03-25 ENCOUNTER — Other Ambulatory Visit: Payer: Medicare Other

## 2022-03-25 LAB — BUN+CREAT
BUN/Creatinine Ratio: 19 (ref 10–24)
BUN: 21 mg/dL (ref 8–27)
Creatinine, Ser: 1.11 mg/dL (ref 0.76–1.27)
eGFR: 68 mL/min/{1.73_m2} (ref >59)

## 2022-03-27 ENCOUNTER — Ambulatory Visit (HOSPITAL_COMMUNITY)
Admission: RE | Admit: 2022-03-27 | Discharge: 2022-03-27 | Disposition: A | Discharge status: Home / Self Care | Payer: Medicare Other | Source: Ambulatory Visit | Attending: Surgery | Admitting: Surgery

## 2022-03-27 DIAGNOSIS — R0989 Other specified symptoms and signs involving the circulatory and respiratory systems: Secondary | ICD-10-CM | POA: Diagnosis not present

## 2022-03-30 ENCOUNTER — Ambulatory Visit (INDEPENDENT_AMBULATORY_CARE_PROVIDER_SITE_OTHER): Payer: Medicare Other

## 2022-03-30 ENCOUNTER — Ambulatory Visit: Payer: Medicare Other | Admitting: Surgery

## 2022-03-30 ENCOUNTER — Encounter: Payer: Self-pay | Admitting: Surgery

## 2022-03-30 VITALS — BP 92/62 | HR 103 | Temp 98.0°F | Resp 20 | Ht 70.0 in | Wt 227.6 lb

## 2022-03-30 DIAGNOSIS — N400 Enlarged prostate without lower urinary tract symptoms: Secondary | ICD-10-CM | POA: Diagnosis not present

## 2022-03-30 DIAGNOSIS — K4020 Bilateral inguinal hernia, without obstruction or gangrene, not specified as recurrent: Secondary | ICD-10-CM | POA: Diagnosis not present

## 2022-03-30 DIAGNOSIS — I724 Aneurysm of artery of lower extremity: Secondary | ICD-10-CM

## 2022-03-30 DIAGNOSIS — I70211 Atherosclerosis of native arteries of extremities with intermittent claudication, right leg: Secondary | ICD-10-CM

## 2022-03-30 DIAGNOSIS — R200 Anesthesia of skin: Secondary | ICD-10-CM | POA: Diagnosis not present

## 2022-03-30 MED ORDER — CILOSTAZOL 100 MG PO TABS: 100.0000 mg | ORAL_TABLET | Freq: Two times a day (BID) | ORAL | 11 refills | Status: AC

## 2022-03-30 MED ORDER — IOHEXOL 350 MG/ML SOLN
200.0000 mL | Freq: Once | INTRAVENOUS | Status: AC | PRN
  Administered 2022-03-30: 125 mL via INTRAVENOUS

## 2022-03-30 NOTE — Progress Notes (Signed)
? ?V[facility] and [facility]in Specialist of [place] ? ?Patient name: Jay Ray MRN: [id number] DOB: 08/12/1941 Sex: male ? ? ?REASON FOR VISIT:  ? ? ?Follow up ? ?HISOTRY OF PRESENT ILLNESS:  ? ? ?Jay Ray is a 81 y.o. male, who iI recently saw for evaluation of peripheral vascular disease.  The patient has a history of atherectomy and stenting of the right superficial femoral artery by Dr. Kathreen Cosier at [facility] approximately 2 years ago.  He has been complaining of numbness in his right toes which did improve a little bit after the procedure.  He has tried gabapentin which did not give him any relief and so he discontinued it.  His most recent ultrasound showed a patent intervention.  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 I sent him for a CTA for further evaluation ?  ?He states that he usually walks three quarters of a mile every day.  Beginning about a month ago, he can only go about a quarter of a mile. ? ?The patient is a smoker with no interest in quitting.  He is on a statin for hypercholesterolemia.  He takes an ARB for hypertension. ? ? ?PAST MEDICAL HISTORY:  ? ?Past Medical History:  ?Diagnosis Date  ? BPH (benign prostatic hyperplasia)   ? Decreased circulation   ? ED (erectile dysfunction)   ? History of colon polyps   ? Hyperlipidemia   ? Hypertension   ? Essential (primary)  ? Obesity   ? Peripheral arterial occlusive disease ([place])   ? Prediabetes   ? Tobacco dependence   ? ? ? ?FAMILY HISTORY:  ? ?History reviewed. No pertinent family history. ? ?SOCIAL HISTORY:  ? ?Social History  ? ?Tobacco Use  ? Smoking status: Every Day  ?  Packs/day: 0.50  ?  Types: Cigarettes  ? Smokeless tobacco: Never  ?Substance Use Topics  ? Alcohol use: Yes  ?  Comment: occasional  ? ? ? ?ALLERGIES:  ? ?No Known Allergies ? ? ?CURRENT MEDICATIONS:  ? ?Current Outpatient Medications  ?Medication Sig Dispense Refill  ? aspirin 81 MG chewable tablet Chew by mouth  daily.    ? clopidogrel (PLAVIX) 75 MG tablet Take 75 mg by mouth daily.    ? co-enzyme Q-10 30 MG capsule Take 30 mg by mouth 3 (three) times daily.    ? doxazosin (CARDURA) 2 MG tablet Take 2 mg by mouth daily.    ? DOXAZOSIN MESYLATE PO Take 2 mg by mouth.    ? LOSARTAN POTASSIUM-HCTZ PO Take by mouth.    ? lovastatin (MEVACOR) 20 MG tablet Take 20 mg by mouth at bedtime.    ? lovastatin (MEVACOR) 40 MG tablet SMARTSIG:1 Tablet(s) By Mouth    ? tamsulosin (FLOMAX) 0.4 MG CAPS capsule Take by mouth.    ? ?No current facility-administered medications for this visit.  ? ? ?REVIEW OF SYSTEMS:  ? ?[X]  denotes positive finding, [ ]  denotes negative finding ?Cardiac  Comments:  ?Chest pain or chest pressure:    ?Shortness of breath upon exertion:    ?Short of breath when lying flat:    ?Irregular heart rhythm:    ?    ?Vascular    ?Pain in calf, thigh, or hip brought on by ambulation: x   ?Pain in feet at night that wakes you up from your sleep:     ?Blood clot in your veins:    ?Leg swelling:     ?    ?  Pulmonary    ?Oxygen at home:    ?Productive cough:     ?Wheezing:     ?    ?Neurologic    ?Sudden weakness in arms or legs:     ?Sudden numbness in arms or legs:     ?Sudden onset of difficulty speaking or slurred speech:    ?Temporary loss of vision in one eye:     ?Problems with dizziness:     ?    ?Gastrointestinal    ?Blood in stool:     ?Vomited blood:     ?    ?Genitourinary    ?Burning when urinating:     ?Blood in urine:    ?    ?Psychiatric    ?Major depression:     ?    ?Hematologic    ?Bleeding problems:    ?Problems with blood clotting too easily:    ?    ?Skin    ?Rashes or ulcers:    ?    ?Constitutional    ?Fever or chills:    ? ? ?PHYSICAL EXAM:  ? ?Vitals:  ? 03/30/22 1505  ?BP: 92/62  ?Pulse: (!) 103  ?Resp: 20  ?Temp: 98 ?F (36.7 ?C)  ?SpO2: 95%  ?Weight: 227 lb 9.6 oz (103.2 kg)  ?Height: 5\' 10"  (1.778 m)  ? ? ?GENERAL: The patient is a well-nourished male, in no acute distress. The vital signs are  documented above. ?CARDIAC: There is a regular rate and rhythm.  ?PULMONARY: Non-labored respirations ?MUSCULOSKELETAL: There are no major deformities or cyanosis. ?NEUROLOGIC: No focal weakness or paresthesias are detected. ?SKIN: There are no ulcers or rashes noted. ?PSYCHIATRIC: The patient has a normal affect. ? ?STUDIES:  ? ?I have reviewed the following CTA: ?1. Chronic occlusion of native distal right SFA approximately 4 cm ?proximal to a stented segment beginning at the SFA/popliteal ?junction and continuing into the proximal aspect of an elongated ?fusiform aneurysm of the right popliteal artery. The 2 separate ?stents are not overlapped and show staggering and are chronically ?occluded. Chronic occlusion also extends through the entire ?elongated fusiform aneurysmal disease of the popliteal artery. The ?distal popliteal segment is reconstituted and there is essentially ?three-vessel patent runoff in the right lower leg with some disease ?seen in the peroneal and posterior tibial arteries. ?2. 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 No associated occlusion. ?Assessment of runoff in the left lower leg is suboptimal, as ?described above. Part of this may relate to the fact that flow in ?the left lower leg is likely much faster than on the right resulting ?in better opacification of the right tibial arteries at the time of ?the second phase of lower leg imaging compared to the left. ?3. 11 x 15 mm smooth nodule at the left lung base which contains ?central calcification and 4 x 6 mm (5 mm mean) noncalcified left ?lower lobe nodule. The larger nodule is likely benign based on ?central calcification. ?5 mm left solid pulmonary nodule. No routine follow-up imaging is ?recommended per Fleischner Society Guidelines. ?These guidelines do not apply to immunocompromised patients and ?patients with cancer. Follow up in patients with significant ?comorbidities  as clinically warranted. For lung cancer screening, ?adhere to Lung-RADS guidelines. Reference: Radiology. 2017; ?284(1):228-43. ?4. Moderate prostate enlargement. ?5. Bilateral inguinal hernias containing fat. ?MEDICAL ISSUES:  ? ?Lower extremity arterial disease: About 6 weeks ago the patient developed claudication symptoms.  He is status post stenting of his  right popliteal artery.  He has a known popliteal aneurysm.  I sent him for CT scan to better evaluate this.  As expected, the stents and popliteal artery have occluded.  Also the popliteal aneurysm has thrombosed.  Therefore, I do not feel that any intervention is necessary for popliteal aneurysm, however he is having significant claudication.  I think in order to address his claudication he would need a below-knee popliteal bypass graft.  I told him I was reluctant to do that for just claudication, however he says his symptoms are not tolerable.  For making the ultimate decision regarding his options, I will try him on [place] see him back in 3 weeks to see if this made any difference.  If not we will consider femoral to below-knee popliteal bypass graft.  I will get him vein mapped when he returns. ? ? ? ?Annamarie Major, IV, MD, FACS ?[facility] [place] ?Tel [phone] ?Pager [phone]  ?

## 2022-04-08 ENCOUNTER — Telehealth: Payer: Self-pay | Admitting: *Deleted

## 2022-04-08 ENCOUNTER — Other Ambulatory Visit: Payer: Self-pay | Admitting: *Deleted

## 2022-04-08 DIAGNOSIS — I724 Aneurysm of artery of lower extremity: Secondary | ICD-10-CM

## 2022-04-08 DIAGNOSIS — I70211 Atherosclerosis of native arteries of extremities with intermittent claudication, right leg: Secondary | ICD-10-CM

## 2022-04-08 NOTE — Telephone Encounter (Signed)
Patients wife called and states patient has had a headache everyday since starting cilostazol ordered by Dr  Myra Gianotti . Advised patient wife to have patient stop medication and see if headaches resolve. She verbalized understanding. Patient has follow up appt scheduled with Dr Myra Gianotti on 04/20/22 and can discuss other available options at that time. ?

## 2022-04-13 ENCOUNTER — Ambulatory Visit: Payer: Medicare Other | Admitting: Surgery

## 2022-04-20 ENCOUNTER — Encounter: Payer: Self-pay | Admitting: Surgery

## 2022-04-20 ENCOUNTER — Ambulatory Visit (HOSPITAL_COMMUNITY)
Admission: RE | Admit: 2022-04-20 | Discharge: 2022-04-20 | Disposition: A | Discharge status: Home / Self Care | Payer: Medicare Other | Source: Ambulatory Visit | Attending: Surgery | Admitting: Surgery

## 2022-04-20 ENCOUNTER — Ambulatory Visit: Payer: Medicare Other | Admitting: Surgery

## 2022-04-20 VITALS — BP 118/81 | HR 97 | Temp 98.0°F | Resp 20 | Ht 70.0 in | Wt 232.0 lb

## 2022-04-20 DIAGNOSIS — I724 Aneurysm of artery of lower extremity: Secondary | ICD-10-CM

## 2022-04-20 DIAGNOSIS — I70211 Atherosclerosis of native arteries of extremities with intermittent claudication, right leg: Secondary | ICD-10-CM

## 2022-04-20 MED ORDER — PENTOXIFYLLINE ER 400 MG PO TBCR: 400.0000 mg | EXTENDED_RELEASE_TABLET | Freq: Three times a day (TID) | ORAL | 12 refills | Status: AC

## 2022-04-20 NOTE — Progress Notes (Signed)
? ?[facility] ? ?Patient name: Jay Ray MRN: [id number] DOB: 10-07-1941 Sex: male ? ? ?REASON FOR VISIT:  ? ? ?Follow up ? ?HISOTRY OF PRESENT ILLNESS:  ? ? ?Jay Ray is a 81 y.o. male, who iI recently saw for evaluation of peripheral vascular disease.  The patient has a history of atherectomy and stenting of the right superficial femoral artery by Dr. Luan Pulling at [facility] approximately 2 years ago.  He has been complaining of numbness in his right toes which did improve a little bit after the procedure.  He has tried gabapentin which did not give him any relief and so he discontinued it.  His most recent ultrasound showed a patent intervention.  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 I sent him for a CTA for further evaluation.  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  He continues to have significant claudication which would require a below-knee bypass.  I told him I was reluctant to do that for claudication and so we gave him a trial of Pletal.  He is back today for follow-up.  He did not tolerate the Pletal because of headaches ?  ?He states that he usually walks three quarters of a mile every day.  Beginning about a month ago, he can only go about a quarter of a mile. ? ?The patient is a smoker with no interest in quitting.  He is on a statin for hypercholesterolemia.  He takes an ARB for hypertension ? ? ?PAST MEDICAL HISTORY:  ? ?Past Medical History:  ?Diagnosis Date  ? BPH (benign prostatic hyperplasia)   ? Decreased circulation   ? ED (erectile dysfunction)   ? History of colon polyps   ? Hyperlipidemia   ? Hypertension   ? Essential (primary)  ? Obesity   ? Peripheral arterial occlusive disease (HCC)   ? Prediabetes   ? Tobacco dependence   ? ? ? ?FAMILY HISTORY:  ? ?History reviewed. No pertinent family history. ? ?SOCIAL HISTORY:  ? ?Social  History  ? ?Tobacco Use  ? Smoking status: Every Day  ?  Packs/day: 0.50  ?  Types: Cigarettes  ? Smokeless tobacco: Never  ?Substance Use Topics  ? Alcohol use: Yes  ?  Comment: occasional  ? ? ? ?ALLERGIES:  ? ?No Known Allergies ? ? ?CURRENT MEDICATIONS:  ? ?Current Outpatient Medications  ?Medication Sig Dispense Refill  ? aspirin 81 MG chewable tablet Chew by mouth daily.    ? clopidogrel (PLAVIX) 75 MG tablet Take 75 mg by mouth daily.    ? co-enzyme Q-10 30 MG capsule Take 30 mg by mouth 3 (three) times daily.    ? doxazosin (CARDURA) 2 MG tablet Take 2 mg by mouth daily.    ? LOSARTAN POTASSIUM-HCTZ PO Take by mouth.    ? lovastatin (MEVACOR) 40 MG tablet SMARTSIG:1 Tablet(s) By Mouth    ? tamsulosin (FLOMAX) 0.4 MG CAPS capsule Take by mouth.    ? cilostazol (PLETAL) 100 MG tablet Take 1 tablet (100 mg total) by mouth 2 (two) times daily before a meal. (Patient not taking: Reported on 04/20/2022) 60 tablet 11  ? ?No current facility-administered medications for this visit.  ? ? ?REVIEW OF SYSTEMS:  ? ?  denotes positive finding,  denotes negative finding ?Cardiac  Comments:  ?Chest pain or chest pressure:    ?Shortness of breath upon exertion:    ?  Short of breath when lying flat:    ?Irregular heart rhythm:    ?    ?Vascular    ?Pain in calf, thigh, or hip brought on by ambulation: x   ?Pain in feet at night that wakes you up from your sleep:     ?Blood clot in your veins:    ?Leg swelling:     ?    ?Pulmonary    ?Oxygen at home:    ?Productive cough:     ?Wheezing:     ?    ?Neurologic    ?Sudden weakness in arms or legs:     ?Sudden numbness in arms or legs:     ?Sudden onset of difficulty speaking or slurred speech:    ?Temporary loss of vision in one eye:     ?Problems with dizziness:     ?    ?Gastrointestinal    ?Blood in stool:     ?Vomited blood:     ?    ?Genitourinary    ?Burning when urinating:     ?Blood in urine:    ?    ?Psychiatric    ?Major depression:     ?    ?Hematologic    ?Bleeding  problems:    ?Problems with blood clotting too easily:    ?    ?Skin    ?Rashes or ulcers:    ?    ?Constitutional    ?Fever or chills:    ? ? ?PHYSICAL EXAM:  ? ?Vitals:  ? 04/20/22 0832  ?BP: 118/81  ?Pulse: 97  ?Resp: 20  ?Temp: 98 ?F (36.7 ?C)  ?SpO2: 94%  ?Weight: 232 lb (105.2 kg)  ?Height: 5\' 10"  (1.778 m)  ? ? ?GENERAL: The patient is a well-nourished male, in no acute distress. The vital signs are documented above. ?CARDIAC: There is a regular rate and rhythm.  ?PULMONARY: Non-labored respirations ?MUSCULOSKELETAL: There are no major deformities or cyanosis. ?NEUROLOGIC: No focal weakness or paresthesias are detected. ?SKIN: There are no ulcers or rashes noted. ?PSYCHIATRIC: The patient has a normal affect. ? ?STUDIES:  ? ?I have reviewed his vascular studies with the following findings: ?+--------------+-------------+----------------------+--------------+-------  ?----+  ? RT Diameter   RT Findings          GSV           LT Diameter  LT  ?Findings  ?     (cm)                                             (cm)             ?      ?+--------------+-------------+----------------------+--------------+-------  ?----+  ?     0.35                      Saphenofemoral         0.43             ?      ?                                  [place]                             ?      ?+--------------+-------------+----------------------+--------------+-------  ?----+  ?  0.19                      Proximal thigh         0.37       ?branching   ?+--------------+-------------+----------------------+--------------+-------  ?----+  ?     0.23       branching        Mid thigh            0.38       ?branching   ?+--------------+-------------+----------------------+--------------+-------  ?----+  ?     0.14                       Distal thigh          0.36             ?      ?+--------------+-------------+----------------------+--------------+-------  ?----+  ?     0.20                            Knee              0.37             ?      ?+--------------+-------------+----------------------+--------------+-------  ?----+  ?     0.27     out of fascia      Prox calf            0.41       ?branching   ?+--------------+-------------+----------------------+--------------+-------  ?----+  ?     0.30                         Mid calf            0.32             ?      ?+--------------+-------------+----------------------+--------------+-------  ?----+  ?     0.45       branching       Distal calf           0.27       ?branching   ?+--------------+-------------+----------------------+--------------+-------  ?----+  ?     0.37                          Ankle              0.29             ?      ?+--------------+-------------+----------------------+--------------+-------  ?----+  ? ?+----------------+-----------+---------------+----------------+-----------+ ?MEDICAL ISSUES:  ? ?Right leg claudication: The patient has had percutaneous treatment of a right popliteal aneurysm which has occluded.  The popliteal aneurysm is also thrombosed.  [facility].  He is having significant claudication.  I tried him on Pletal, however he did not tolerate this because of headaches.  I brought him back today for evaluation.  He had vein mapping performed that showed a small saphenous vein.  If we proceed with revascularization, it would likely be a femoral to below-knee popliteal artery bypass graft with Gore-Tex, however I would evaluate his saphenous vein in the operating room and use it if possible.  I told him that I would like to avoid surgery if at all possible, if it is done just for claudication.  We are going to give him a trial of Trental now to see if this  helps with his symptoms.  He will follow-up with me in 1 month ? 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  I will need to follow this with ultrasound later this year. ? ? ? ?Durene Cal, IV,  MD, FACS ?[facility] ?Tel [phone] ?Pager [phone]  ?

## 2022-05-07 DIAGNOSIS — F17210 Nicotine dependence, cigarettes, uncomplicated: Secondary | ICD-10-CM | POA: Diagnosis not present

## 2022-05-07 DIAGNOSIS — Z48812 Encounter for surgical aftercare following surgery on the circulatory system: Secondary | ICD-10-CM | POA: Diagnosis not present

## 2022-05-07 DIAGNOSIS — I739 Peripheral vascular disease, unspecified: Secondary | ICD-10-CM | POA: Diagnosis not present

## 2022-05-29 DIAGNOSIS — I739 Peripheral vascular disease, unspecified: Secondary | ICD-10-CM | POA: Diagnosis not present

## 2022-06-08 ENCOUNTER — Ambulatory Visit: Payer: Medicare Other | Admitting: Surgery

## 2022-06-22 DIAGNOSIS — Z7982 Long term (current) use of aspirin: Secondary | ICD-10-CM | POA: Diagnosis not present

## 2022-06-22 DIAGNOSIS — I10 Essential (primary) hypertension: Secondary | ICD-10-CM | POA: Diagnosis not present

## 2022-06-22 DIAGNOSIS — Z79899 Other long term (current) drug therapy: Secondary | ICD-10-CM | POA: Diagnosis not present

## 2022-06-22 DIAGNOSIS — G4733 Obstructive sleep apnea (adult) (pediatric): Secondary | ICD-10-CM | POA: Diagnosis not present

## 2022-06-22 DIAGNOSIS — Z6832 Body mass index (BMI) 32.0-32.9, adult: Secondary | ICD-10-CM | POA: Diagnosis not present

## 2022-06-22 DIAGNOSIS — I739 Peripheral vascular disease, unspecified: Secondary | ICD-10-CM | POA: Diagnosis not present

## 2022-06-22 DIAGNOSIS — I724 Aneurysm of artery of lower extremity: Secondary | ICD-10-CM | POA: Diagnosis not present

## 2022-06-22 DIAGNOSIS — I70201 Unspecified atherosclerosis of native arteries of extremities, right leg: Secondary | ICD-10-CM | POA: Diagnosis not present

## 2022-06-22 DIAGNOSIS — I743 Embolism and thrombosis of arteries of the lower extremities: Secondary | ICD-10-CM | POA: Diagnosis not present

## 2022-06-22 DIAGNOSIS — Z7902 Long term (current) use of antithrombotics/antiplatelets: Secondary | ICD-10-CM | POA: Diagnosis not present

## 2022-06-22 DIAGNOSIS — T82856A Stenosis of peripheral vascular stent, initial encounter: Secondary | ICD-10-CM | POA: Diagnosis not present

## 2022-06-22 DIAGNOSIS — F17210 Nicotine dependence, cigarettes, uncomplicated: Secondary | ICD-10-CM | POA: Diagnosis not present

## 2022-06-22 DIAGNOSIS — G4730 Sleep apnea, unspecified: Secondary | ICD-10-CM | POA: Diagnosis not present

## 2022-06-22 DIAGNOSIS — E669 Obesity, unspecified: Secondary | ICD-10-CM | POA: Diagnosis not present

## 2022-06-22 DIAGNOSIS — Z8249 Family history of ischemic heart disease and other diseases of the circulatory system: Secondary | ICD-10-CM | POA: Diagnosis not present

## 2022-06-22 DIAGNOSIS — I70221 Atherosclerosis of native arteries of extremities with rest pain, right leg: Secondary | ICD-10-CM | POA: Diagnosis not present

## 2022-06-22 DIAGNOSIS — E785 Hyperlipidemia, unspecified: Secondary | ICD-10-CM | POA: Diagnosis not present

## 2022-07-02 DIAGNOSIS — L570 Actinic keratosis: Secondary | ICD-10-CM | POA: Diagnosis not present

## 2022-07-02 DIAGNOSIS — L812 Freckles: Secondary | ICD-10-CM | POA: Diagnosis not present

## 2022-07-02 DIAGNOSIS — Z85828 Personal history of other malignant neoplasm of skin: Secondary | ICD-10-CM | POA: Diagnosis not present

## 2022-07-02 DIAGNOSIS — L853 Xerosis cutis: Secondary | ICD-10-CM | POA: Diagnosis not present

## 2022-07-06 DIAGNOSIS — R972 Elevated prostate specific antigen [PSA]: Secondary | ICD-10-CM | POA: Diagnosis not present

## 2022-07-20 DIAGNOSIS — R972 Elevated prostate specific antigen [PSA]: Secondary | ICD-10-CM | POA: Diagnosis not present

## 2022-07-20 DIAGNOSIS — N401 Enlarged prostate with lower urinary tract symptoms: Secondary | ICD-10-CM | POA: Diagnosis not present

## 2022-07-20 DIAGNOSIS — R350 Frequency of micturition: Secondary | ICD-10-CM | POA: Diagnosis not present

## 2022-07-21 DIAGNOSIS — I739 Peripheral vascular disease, unspecified: Secondary | ICD-10-CM | POA: Diagnosis not present

## 2022-07-21 DIAGNOSIS — Z48812 Encounter for surgical aftercare following surgery on the circulatory system: Secondary | ICD-10-CM | POA: Diagnosis not present

## 2022-08-19 DIAGNOSIS — I779 Disorder of arteries and arterioles, unspecified: Secondary | ICD-10-CM | POA: Diagnosis not present

## 2022-08-19 DIAGNOSIS — I10 Essential (primary) hypertension: Secondary | ICD-10-CM | POA: Diagnosis not present

## 2022-08-19 DIAGNOSIS — E7800 Pure hypercholesterolemia, unspecified: Secondary | ICD-10-CM | POA: Diagnosis not present

## 2022-08-19 DIAGNOSIS — Z79899 Other long term (current) drug therapy: Secondary | ICD-10-CM | POA: Diagnosis not present

## 2022-08-21 DIAGNOSIS — Z961 Presence of intraocular lens: Secondary | ICD-10-CM | POA: Diagnosis not present

## 2022-08-21 DIAGNOSIS — H04123 Dry eye syndrome of bilateral lacrimal glands: Secondary | ICD-10-CM | POA: Diagnosis not present

## 2022-09-04 DIAGNOSIS — Z79899 Other long term (current) drug therapy: Secondary | ICD-10-CM | POA: Diagnosis not present

## 2022-09-04 DIAGNOSIS — E7800 Pure hypercholesterolemia, unspecified: Secondary | ICD-10-CM | POA: Diagnosis not present

## 2022-10-28 DIAGNOSIS — H6123 Impacted cerumen, bilateral: Secondary | ICD-10-CM | POA: Diagnosis not present

## 2022-11-23 DIAGNOSIS — E7800 Pure hypercholesterolemia, unspecified: Secondary | ICD-10-CM | POA: Diagnosis not present

## 2022-11-23 DIAGNOSIS — Z0000 Encounter for general adult medical examination without abnormal findings: Secondary | ICD-10-CM | POA: Diagnosis not present

## 2022-11-23 DIAGNOSIS — I70211 Atherosclerosis of native arteries of extremities with intermittent claudication, right leg: Secondary | ICD-10-CM | POA: Diagnosis not present

## 2022-11-23 DIAGNOSIS — I10 Essential (primary) hypertension: Secondary | ICD-10-CM | POA: Diagnosis not present

## 2023-01-04 DIAGNOSIS — L578 Other skin changes due to chronic exposure to nonionizing radiation: Secondary | ICD-10-CM | POA: Diagnosis not present

## 2023-01-04 DIAGNOSIS — L570 Actinic keratosis: Secondary | ICD-10-CM | POA: Diagnosis not present

## 2023-01-04 DIAGNOSIS — L821 Other seborrheic keratosis: Secondary | ICD-10-CM | POA: Diagnosis not present

## 2023-01-04 DIAGNOSIS — C44622 Squamous cell carcinoma of skin of right upper limb, including shoulder: Secondary | ICD-10-CM | POA: Diagnosis not present

## 2023-01-04 DIAGNOSIS — Z85828 Personal history of other malignant neoplasm of skin: Secondary | ICD-10-CM | POA: Diagnosis not present

## 2023-01-05 DIAGNOSIS — R972 Elevated prostate specific antigen [PSA]: Secondary | ICD-10-CM | POA: Diagnosis not present

## 2023-01-12 DIAGNOSIS — N401 Enlarged prostate with lower urinary tract symptoms: Secondary | ICD-10-CM | POA: Diagnosis not present

## 2023-01-12 DIAGNOSIS — R972 Elevated prostate specific antigen [PSA]: Secondary | ICD-10-CM | POA: Diagnosis not present

## 2023-01-12 DIAGNOSIS — R3912 Poor urinary stream: Secondary | ICD-10-CM | POA: Diagnosis not present

## 2023-01-12 DIAGNOSIS — R351 Nocturia: Secondary | ICD-10-CM | POA: Diagnosis not present

## 2023-02-02 DIAGNOSIS — Z86010 Personal history of colonic polyps: Secondary | ICD-10-CM | POA: Diagnosis not present

## 2023-02-02 DIAGNOSIS — D123 Benign neoplasm of transverse colon: Secondary | ICD-10-CM | POA: Diagnosis not present

## 2023-02-02 DIAGNOSIS — D125 Benign neoplasm of sigmoid colon: Secondary | ICD-10-CM | POA: Diagnosis not present

## 2023-02-02 DIAGNOSIS — K5730 Diverticulosis of large intestine without perforation or abscess without bleeding: Secondary | ICD-10-CM | POA: Diagnosis not present

## 2023-02-02 DIAGNOSIS — K644 Residual hemorrhoidal skin tags: Secondary | ICD-10-CM | POA: Diagnosis not present

## 2023-02-02 DIAGNOSIS — K640 First degree hemorrhoids: Secondary | ICD-10-CM | POA: Diagnosis not present

## 2023-02-02 DIAGNOSIS — D128 Benign neoplasm of rectum: Secondary | ICD-10-CM | POA: Diagnosis not present

## 2023-02-02 DIAGNOSIS — Z09 Encounter for follow-up examination after completed treatment for conditions other than malignant neoplasm: Secondary | ICD-10-CM | POA: Diagnosis not present

## 2023-02-04 DIAGNOSIS — D123 Benign neoplasm of transverse colon: Secondary | ICD-10-CM | POA: Diagnosis not present

## 2023-02-04 DIAGNOSIS — D128 Benign neoplasm of rectum: Secondary | ICD-10-CM | POA: Diagnosis not present

## 2023-02-15 DIAGNOSIS — I739 Peripheral vascular disease, unspecified: Secondary | ICD-10-CM | POA: Diagnosis not present

## 2023-02-15 DIAGNOSIS — Z720 Tobacco use: Secondary | ICD-10-CM | POA: Diagnosis not present

## 2023-02-15 DIAGNOSIS — Z48812 Encounter for surgical aftercare following surgery on the circulatory system: Secondary | ICD-10-CM | POA: Diagnosis not present

## 2023-02-15 DIAGNOSIS — I724 Aneurysm of artery of lower extremity: Secondary | ICD-10-CM | POA: Diagnosis not present

## 2023-02-15 DIAGNOSIS — T82858A Stenosis of vascular prosthetic devices, implants and grafts, initial encounter: Secondary | ICD-10-CM | POA: Diagnosis not present

## 2023-02-18 DIAGNOSIS — Z1330 Encounter for screening examination for mental health and behavioral disorders, unspecified: Secondary | ICD-10-CM | POA: Diagnosis not present

## 2023-02-18 DIAGNOSIS — I739 Peripheral vascular disease, unspecified: Secondary | ICD-10-CM | POA: Diagnosis not present

## 2023-03-03 DIAGNOSIS — I739 Peripheral vascular disease, unspecified: Secondary | ICD-10-CM | POA: Diagnosis not present

## 2023-03-03 DIAGNOSIS — I10 Essential (primary) hypertension: Secondary | ICD-10-CM | POA: Diagnosis not present

## 2023-03-03 DIAGNOSIS — Z7982 Long term (current) use of aspirin: Secondary | ICD-10-CM | POA: Diagnosis not present

## 2023-03-03 DIAGNOSIS — Z808 Family history of malignant neoplasm of other organs or systems: Secondary | ICD-10-CM | POA: Diagnosis not present

## 2023-03-03 DIAGNOSIS — Z79899 Other long term (current) drug therapy: Secondary | ICD-10-CM | POA: Diagnosis not present

## 2023-03-03 DIAGNOSIS — F17210 Nicotine dependence, cigarettes, uncomplicated: Secondary | ICD-10-CM | POA: Diagnosis not present

## 2023-03-03 DIAGNOSIS — T82858A Stenosis of vascular prosthetic devices, implants and grafts, initial encounter: Secondary | ICD-10-CM | POA: Diagnosis not present

## 2023-03-03 DIAGNOSIS — E785 Hyperlipidemia, unspecified: Secondary | ICD-10-CM | POA: Diagnosis not present

## 2023-03-03 DIAGNOSIS — Y839 Surgical procedure, unspecified as the cause of abnormal reaction of the patient, or of later complication, without mention of misadventure at the time of the procedure: Secondary | ICD-10-CM | POA: Diagnosis not present

## 2023-03-03 DIAGNOSIS — Z8249 Family history of ischemic heart disease and other diseases of the circulatory system: Secondary | ICD-10-CM | POA: Diagnosis not present

## 2023-03-30 DIAGNOSIS — Z48812 Encounter for surgical aftercare following surgery on the circulatory system: Secondary | ICD-10-CM | POA: Diagnosis not present

## 2023-03-30 DIAGNOSIS — I739 Peripheral vascular disease, unspecified: Secondary | ICD-10-CM | POA: Diagnosis not present

## 2023-04-07 DIAGNOSIS — Z720 Tobacco use: Secondary | ICD-10-CM | POA: Diagnosis not present

## 2023-04-07 DIAGNOSIS — I739 Peripheral vascular disease, unspecified: Secondary | ICD-10-CM | POA: Diagnosis not present

## 2023-04-07 DIAGNOSIS — Z48812 Encounter for surgical aftercare following surgery on the circulatory system: Secondary | ICD-10-CM | POA: Diagnosis not present

## 2023-04-12 DIAGNOSIS — K080 Exfoliation of teeth due to systemic causes: Secondary | ICD-10-CM | POA: Diagnosis not present

## 2023-04-13 DIAGNOSIS — K080 Exfoliation of teeth due to systemic causes: Secondary | ICD-10-CM | POA: Diagnosis not present

## 2023-05-27 DIAGNOSIS — E7800 Pure hypercholesterolemia, unspecified: Secondary | ICD-10-CM | POA: Diagnosis not present

## 2023-05-27 DIAGNOSIS — I70211 Atherosclerosis of native arteries of extremities with intermittent claudication, right leg: Secondary | ICD-10-CM | POA: Diagnosis not present

## 2023-05-27 DIAGNOSIS — I10 Essential (primary) hypertension: Secondary | ICD-10-CM | POA: Diagnosis not present

## 2023-05-27 DIAGNOSIS — R7303 Prediabetes: Secondary | ICD-10-CM | POA: Diagnosis not present

## 2023-07-06 DIAGNOSIS — R972 Elevated prostate specific antigen [PSA]: Secondary | ICD-10-CM | POA: Diagnosis not present

## 2023-07-13 DIAGNOSIS — N401 Enlarged prostate with lower urinary tract symptoms: Secondary | ICD-10-CM | POA: Diagnosis not present

## 2023-07-13 DIAGNOSIS — R972 Elevated prostate specific antigen [PSA]: Secondary | ICD-10-CM | POA: Diagnosis not present

## 2023-07-13 DIAGNOSIS — R351 Nocturia: Secondary | ICD-10-CM | POA: Diagnosis not present

## 2023-07-26 DIAGNOSIS — L821 Other seborrheic keratosis: Secondary | ICD-10-CM | POA: Diagnosis not present

## 2023-07-26 DIAGNOSIS — Z85828 Personal history of other malignant neoplasm of skin: Secondary | ICD-10-CM | POA: Diagnosis not present

## 2023-07-26 DIAGNOSIS — L812 Freckles: Secondary | ICD-10-CM | POA: Diagnosis not present

## 2023-07-26 DIAGNOSIS — C44619 Basal cell carcinoma of skin of left upper limb, including shoulder: Secondary | ICD-10-CM | POA: Diagnosis not present

## 2023-07-26 DIAGNOSIS — L570 Actinic keratosis: Secondary | ICD-10-CM | POA: Diagnosis not present

## 2023-07-26 DIAGNOSIS — D1801 Hemangioma of skin and subcutaneous tissue: Secondary | ICD-10-CM | POA: Diagnosis not present

## 2023-07-27 DIAGNOSIS — K080 Exfoliation of teeth due to systemic causes: Secondary | ICD-10-CM | POA: Diagnosis not present

## 2023-09-24 DIAGNOSIS — Z961 Presence of intraocular lens: Secondary | ICD-10-CM | POA: Diagnosis not present

## 2023-09-24 DIAGNOSIS — H04123 Dry eye syndrome of bilateral lacrimal glands: Secondary | ICD-10-CM | POA: Diagnosis not present

## 2023-09-27 DIAGNOSIS — Z48812 Encounter for surgical aftercare following surgery on the circulatory system: Secondary | ICD-10-CM | POA: Diagnosis not present

## 2023-09-30 DIAGNOSIS — I739 Peripheral vascular disease, unspecified: Secondary | ICD-10-CM | POA: Diagnosis not present

## 2023-09-30 DIAGNOSIS — I724 Aneurysm of artery of lower extremity: Secondary | ICD-10-CM | POA: Diagnosis not present

## 2023-10-13 DIAGNOSIS — H6123 Impacted cerumen, bilateral: Secondary | ICD-10-CM | POA: Diagnosis not present

## 2023-11-08 DIAGNOSIS — I724 Aneurysm of artery of lower extremity: Secondary | ICD-10-CM | POA: Diagnosis not present

## 2023-11-08 DIAGNOSIS — I70211 Atherosclerosis of native arteries of extremities with intermittent claudication, right leg: Secondary | ICD-10-CM | POA: Diagnosis not present

## 2023-11-08 DIAGNOSIS — Z0000 Encounter for general adult medical examination without abnormal findings: Secondary | ICD-10-CM | POA: Diagnosis not present

## 2023-11-08 DIAGNOSIS — I10 Essential (primary) hypertension: Secondary | ICD-10-CM | POA: Diagnosis not present

## 2023-11-08 DIAGNOSIS — E7800 Pure hypercholesterolemia, unspecified: Secondary | ICD-10-CM | POA: Diagnosis not present

## 2023-12-13 DIAGNOSIS — K080 Exfoliation of teeth due to systemic causes: Secondary | ICD-10-CM | POA: Diagnosis not present

## 2024-01-05 DIAGNOSIS — K080 Exfoliation of teeth due to systemic causes: Secondary | ICD-10-CM | POA: Diagnosis not present

## 2024-01-27 DIAGNOSIS — D1801 Hemangioma of skin and subcutaneous tissue: Secondary | ICD-10-CM | POA: Diagnosis not present

## 2024-01-27 DIAGNOSIS — L570 Actinic keratosis: Secondary | ICD-10-CM | POA: Diagnosis not present

## 2024-01-27 DIAGNOSIS — L821 Other seborrheic keratosis: Secondary | ICD-10-CM | POA: Diagnosis not present

## 2024-01-27 DIAGNOSIS — Z85828 Personal history of other malignant neoplasm of skin: Secondary | ICD-10-CM | POA: Diagnosis not present

## 2024-03-24 DIAGNOSIS — K080 Exfoliation of teeth due to systemic causes: Secondary | ICD-10-CM | POA: Diagnosis not present

## 2024-04-03 DIAGNOSIS — I724 Aneurysm of artery of lower extremity: Secondary | ICD-10-CM | POA: Diagnosis not present

## 2024-04-03 DIAGNOSIS — I70201 Unspecified atherosclerosis of native arteries of extremities, right leg: Secondary | ICD-10-CM | POA: Diagnosis not present

## 2024-04-03 DIAGNOSIS — T82898A Other specified complication of vascular prosthetic devices, implants and grafts, initial encounter: Secondary | ICD-10-CM | POA: Diagnosis not present

## 2024-04-03 DIAGNOSIS — I739 Peripheral vascular disease, unspecified: Secondary | ICD-10-CM | POA: Diagnosis not present

## 2024-04-13 DIAGNOSIS — I739 Peripheral vascular disease, unspecified: Secondary | ICD-10-CM | POA: Diagnosis not present

## 2024-05-01 DIAGNOSIS — I10 Essential (primary) hypertension: Secondary | ICD-10-CM | POA: Diagnosis not present

## 2024-05-01 DIAGNOSIS — F17200 Nicotine dependence, unspecified, uncomplicated: Secondary | ICD-10-CM | POA: Diagnosis not present

## 2024-05-01 DIAGNOSIS — Z131 Encounter for screening for diabetes mellitus: Secondary | ICD-10-CM | POA: Diagnosis not present

## 2024-05-01 DIAGNOSIS — I70211 Atherosclerosis of native arteries of extremities with intermittent claudication, right leg: Secondary | ICD-10-CM | POA: Diagnosis not present

## 2024-05-01 DIAGNOSIS — E7800 Pure hypercholesterolemia, unspecified: Secondary | ICD-10-CM | POA: Diagnosis not present

## 2024-07-20 DIAGNOSIS — I739 Peripheral vascular disease, unspecified: Secondary | ICD-10-CM | POA: Diagnosis not present

## 2024-07-27 DIAGNOSIS — L578 Other skin changes due to chronic exposure to nonionizing radiation: Secondary | ICD-10-CM | POA: Diagnosis not present

## 2024-07-27 DIAGNOSIS — L812 Freckles: Secondary | ICD-10-CM | POA: Diagnosis not present

## 2024-07-27 DIAGNOSIS — L853 Xerosis cutis: Secondary | ICD-10-CM | POA: Diagnosis not present

## 2024-07-27 DIAGNOSIS — Z85828 Personal history of other malignant neoplasm of skin: Secondary | ICD-10-CM | POA: Diagnosis not present

## 2024-07-27 DIAGNOSIS — L570 Actinic keratosis: Secondary | ICD-10-CM | POA: Diagnosis not present

## 2024-08-04 DIAGNOSIS — R972 Elevated prostate specific antigen [PSA]: Secondary | ICD-10-CM | POA: Diagnosis not present

## 2024-08-11 DIAGNOSIS — N401 Enlarged prostate with lower urinary tract symptoms: Secondary | ICD-10-CM | POA: Diagnosis not present

## 2024-08-11 DIAGNOSIS — R972 Elevated prostate specific antigen [PSA]: Secondary | ICD-10-CM | POA: Diagnosis not present

## 2024-08-11 DIAGNOSIS — R3912 Poor urinary stream: Secondary | ICD-10-CM | POA: Diagnosis not present

## 2024-08-14 DIAGNOSIS — K080 Exfoliation of teeth due to systemic causes: Secondary | ICD-10-CM | POA: Diagnosis not present

## 2024-09-05 DIAGNOSIS — H903 Sensorineural hearing loss, bilateral: Secondary | ICD-10-CM | POA: Diagnosis not present

## 2024-09-05 DIAGNOSIS — H6123 Impacted cerumen, bilateral: Secondary | ICD-10-CM | POA: Diagnosis not present

## 2024-10-02 DIAGNOSIS — K080 Exfoliation of teeth due to systemic causes: Secondary | ICD-10-CM | POA: Diagnosis not present

## 2024-10-10 DIAGNOSIS — M62830 Muscle spasm of back: Secondary | ICD-10-CM | POA: Diagnosis not present

## 2024-10-10 DIAGNOSIS — I739 Peripheral vascular disease, unspecified: Secondary | ICD-10-CM | POA: Diagnosis not present

## 2024-10-11 DIAGNOSIS — K080 Exfoliation of teeth due to systemic causes: Secondary | ICD-10-CM | POA: Diagnosis not present

## 2024-10-12 DIAGNOSIS — I739 Peripheral vascular disease, unspecified: Secondary | ICD-10-CM | POA: Diagnosis not present

## 2024-10-26 DIAGNOSIS — Z961 Presence of intraocular lens: Secondary | ICD-10-CM | POA: Diagnosis not present

## 2024-11-15 DIAGNOSIS — Z1331 Encounter for screening for depression: Secondary | ICD-10-CM | POA: Diagnosis not present

## 2024-11-15 DIAGNOSIS — R7303 Prediabetes: Secondary | ICD-10-CM | POA: Diagnosis not present

## 2024-11-15 DIAGNOSIS — I70211 Atherosclerosis of native arteries of extremities with intermittent claudication, right leg: Secondary | ICD-10-CM | POA: Diagnosis not present

## 2024-11-15 DIAGNOSIS — E7800 Pure hypercholesterolemia, unspecified: Secondary | ICD-10-CM | POA: Diagnosis not present

## 2024-11-15 DIAGNOSIS — Z0000 Encounter for general adult medical examination without abnormal findings: Secondary | ICD-10-CM | POA: Diagnosis not present

## 2024-11-15 DIAGNOSIS — I10 Essential (primary) hypertension: Secondary | ICD-10-CM | POA: Diagnosis not present

## 2024-11-15 DIAGNOSIS — I724 Aneurysm of artery of lower extremity: Secondary | ICD-10-CM | POA: Diagnosis not present
